# Patient Record
Sex: Male | Born: 1940 | Race: White | Hispanic: No | Marital: Married | Smoking: Never smoker
Health system: Southern US, Community
[De-identification: ages and names within clinical notes are randomized; demographics above are authoritative.]

## PROBLEM LIST (undated history)

## (undated) DIAGNOSIS — E039 Hypothyroidism, unspecified: Secondary | ICD-10-CM

## (undated) DIAGNOSIS — E785 Hyperlipidemia, unspecified: Secondary | ICD-10-CM

## (undated) DIAGNOSIS — I4891 Unspecified atrial fibrillation: Secondary | ICD-10-CM

## (undated) DIAGNOSIS — I10 Essential (primary) hypertension: Secondary | ICD-10-CM

## (undated) DIAGNOSIS — F419 Anxiety disorder, unspecified: Secondary | ICD-10-CM

## (undated) DIAGNOSIS — F329 Major depressive disorder, single episode, unspecified: Secondary | ICD-10-CM

## (undated) DIAGNOSIS — M1990 Unspecified osteoarthritis, unspecified site: Secondary | ICD-10-CM

## (undated) DIAGNOSIS — K219 Gastro-esophageal reflux disease without esophagitis: Secondary | ICD-10-CM

## (undated) DIAGNOSIS — G4730 Sleep apnea, unspecified: Secondary | ICD-10-CM

## (undated) DIAGNOSIS — Z8739 Personal history of other diseases of the musculoskeletal system and connective tissue: Secondary | ICD-10-CM

## (undated) DIAGNOSIS — D509 Iron deficiency anemia, unspecified: Secondary | ICD-10-CM

## (undated) DIAGNOSIS — F32A Depression, unspecified: Secondary | ICD-10-CM

## (undated) HISTORY — PX: JOINT REPLACEMENT: SHX530

## (undated) HISTORY — DX: Iron deficiency anemia, unspecified: D50.9

## (undated) HISTORY — PX: OTHER SURGICAL HISTORY: SHX169

## (undated) HISTORY — DX: Unspecified atrial fibrillation: I48.91

## (undated) HISTORY — PX: CHOLECYSTECTOMY: SHX55

---

## 2005-07-26 ENCOUNTER — Encounter (INDEPENDENT_AMBULATORY_CARE_PROVIDER_SITE_OTHER): Payer: Self-pay | Admitting: Internal Medicine

## 2005-07-26 ENCOUNTER — Ambulatory Visit (HOSPITAL_COMMUNITY): Admission: RE | Admit: 2005-07-26 | Discharge: 2005-07-26 | Payer: Self-pay | Admitting: Internal Medicine

## 2005-07-26 ENCOUNTER — Ambulatory Visit: Payer: Self-pay | Admitting: Internal Medicine

## 2010-08-20 ENCOUNTER — Encounter (INDEPENDENT_AMBULATORY_CARE_PROVIDER_SITE_OTHER): Payer: Self-pay | Admitting: *Deleted

## 2010-10-14 ENCOUNTER — Ambulatory Visit: Payer: Self-pay | Admitting: Internal Medicine

## 2010-10-14 ENCOUNTER — Ambulatory Visit (HOSPITAL_COMMUNITY): Admission: RE | Admit: 2010-10-14 | Discharge: 2010-10-14 | Payer: Self-pay | Admitting: Internal Medicine

## 2011-01-11 NOTE — Letter (Signed)
Summary: Recall, Screening Colonoscopy Only  [facility]  [address]   [place], [place] [address]   Phone: [phone]  Fax: [phone]    August 20, 2010  [facility] [place] [address] Jonny Ruiz [place], [place]  [address] 10-Mar-1941   Dear Mr. Nabi,   Our records indicate it is time to schedule your colonoscopy.    Please call our office at [phone] and ask for the nurse.   Thank you,    Hendricks Limes, LPN Cloria Spring, LPN  [facility] Ph: [phone]   Fax: [phone]

## 2011-02-10 ENCOUNTER — Emergency Department (HOSPITAL_COMMUNITY)
Admission: EM | Admit: 2011-02-10 | Discharge: 2011-02-10 | Disposition: A | Discharge status: Home / Self Care | Payer: Medicare Other | Attending: Emergency Medicine | Admitting: Emergency Medicine

## 2011-02-10 ENCOUNTER — Emergency Department (HOSPITAL_COMMUNITY): Payer: Medicare Other

## 2011-02-10 DIAGNOSIS — M65979 Unspecified synovitis and tenosynovitis, unspecified ankle and foot: Secondary | ICD-10-CM | POA: Insufficient documentation

## 2011-02-10 DIAGNOSIS — L02619 Cutaneous abscess of unspecified foot: Secondary | ICD-10-CM | POA: Insufficient documentation

## 2011-02-10 DIAGNOSIS — M25579 Pain in unspecified ankle and joints of unspecified foot: Secondary | ICD-10-CM | POA: Insufficient documentation

## 2011-02-10 DIAGNOSIS — M109 Gout, unspecified: Secondary | ICD-10-CM | POA: Insufficient documentation

## 2011-02-10 DIAGNOSIS — M659 Synovitis and tenosynovitis, unspecified: Secondary | ICD-10-CM | POA: Insufficient documentation

## 2011-02-10 MED ORDER — GADOBENATE DIMEGLUMINE 529 MG/ML IV SOLN: 15.0000 mL | Freq: Once | INTRAVENOUS | Status: DC | PRN

## 2011-04-29 NOTE — Op Note (Signed)
Allen Hebert, SALZMAN                ACCOUNT NO.:  [id number]   MEDICAL RECORD NO.:  [id number]          PATIENT TYPE:  AMB   LOCATION:  DAY                           FACILITY:  [facility]   PHYSICIAN:  Lionel December, M.D.    DATE OF BIRTH:  Apr 25, 1941   DATE OF PROCEDURE:  07/26/2005  DATE OF DISCHARGE:                                 OPERATIVE REPORT   PROCEDURE:  Colonoscopy with polypectomy.   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  He is now returning for  surveillance examination. Procedures were reviewed the patient, informed  consent was obtained.   PREMEDICATION:  Demerol 50 milligrams IV, Versed 6 milligrams IV in divided  dose.   FINDINGS:  Procedure performed in endoscopy suite. The patient's vital signs  and O2 sat were monitored during procedure and remained stable. The patient  was placed left lateral position. Rectal examination performed. No  abnormality noted external or digital exam.  His prostate was felt to be  large but no nodules or hot areas were noted. The Olympus videoscope was  placed rectum and advanced under vision into sigmoid colon and beyond. He  had few small diverticula at sigmoid colon. Preparation was satisfactory.  Scope was passed cecum which was identified by appendiceal orifice and  ileocecal valve. Two small polyps in this area, one just above ileocecal  valve that was snared and retrieved for histologic examination in two  pieces. Other one was much smaller on a fold on the right side which was  coagulated using snare tip. There was another 6 to 7 mm sessile polyp at the  ascending colon which was snared and retrieved for histologic examination.  Mucosa rest of the colon was normal. Rectal mucosa was also normal. Scope  was retroflexed to examine anorectal junction and small hemorrhoids were  noted below the dentate line. Endoscope was straightened and withdrawn. The  patient  tolerated the procedure well.   FINAL DIAGNOSIS:  Two small polyps snared, one from cecum, another one from  the ascending colon.   Third polyp was even smaller at cecum and was coagulated using snare tip.   Few small scattered diverticula at sigmoid colon, external hemorrhoids.   RECOMMENDATIONS:  Standard instructions given. No aspirin for one week. If  possible, I would like for no hold off his Feldene for the next two to three  days.   I will be contacting the patient with biopsy results and further  recommendations.   High-fiber diet.      Lionel December, M.D.  Electronically Signed     NR/MEDQ  D:  07/26/2005  T:  07/26/2005  Job:  [id number]   cc:   Robyne Peers  [address]  [place]  [place] [address]  Fax: [phone]

## 2013-10-15 ENCOUNTER — Encounter (INDEPENDENT_AMBULATORY_CARE_PROVIDER_SITE_OTHER): Payer: Self-pay | Admitting: *Deleted

## 2013-10-24 ENCOUNTER — Other Ambulatory Visit (INDEPENDENT_AMBULATORY_CARE_PROVIDER_SITE_OTHER): Payer: Self-pay | Admitting: *Deleted

## 2013-10-24 ENCOUNTER — Telehealth (INDEPENDENT_AMBULATORY_CARE_PROVIDER_SITE_OTHER): Payer: Self-pay | Admitting: *Deleted

## 2013-10-24 DIAGNOSIS — Z86010 Personal history of colonic polyps: Secondary | ICD-10-CM

## 2013-10-24 DIAGNOSIS — Z1211 Encounter for screening for malignant neoplasm of colon: Secondary | ICD-10-CM

## 2013-10-24 MED ORDER — AMOXICILLIN 500 MG PO TABS: 500.0000 mg | ORAL_TABLET | ORAL | Status: DC

## 2013-10-24 MED ORDER — PEG-KCL-NACL-NASULF-NA ASC-C 100 G PO SOLR: 1.0000 | Freq: Once | ORAL | Status: DC

## 2013-10-24 NOTE — Telephone Encounter (Signed)
Patient needs movi prep & Amoxicillin -- had hip replacement and has to take this prior to dental procedures so he will need prior to TCS -- he takes Amoxicillin 500 mg 4 tablets prior to procedure

## 2013-10-30 ENCOUNTER — Telehealth (INDEPENDENT_AMBULATORY_CARE_PROVIDER_SITE_OTHER): Payer: Self-pay | Admitting: *Deleted

## 2013-10-30 NOTE — Telephone Encounter (Signed)
agree

## 2013-10-30 NOTE — Telephone Encounter (Signed)
  Procedure: tcs  Reason/Indication:  Hx polyps  Has patient had this procedure before?  Yes, 2011 (paper chart  If so, when, by whom and where?    Is there a family history of colon cancer?  no  Who?  What age when diagnosed?    Is patient diabetic?   no      Does patient have prosthetic heart valve?  no  Do you have a pacemaker?  no  Has patient ever had endocarditis? no  Has patient had joint replacement within last 12 months?  no  Does patient tend to be constipated or take laxatives?   Is patient on Coumadin, Plavix and/or Aspirin? yes  Medications: levothyroxine 75 mcg daily, vit d 3, co 1 10, fish oil, fluoxetine, allopurinol 300 mg daily, simvastatin 20 mg daily, zolpidem 10 mg 1/2 tab daily, asa 81 mg daily, ranitidine 150 mg bid, losartan 25 mg daily, meloxicam 15 mg daily  Allergies: nkda  Medication Adjustment: asa 2 days  Procedure date & time: 11/28/13 at 100

## 2013-11-21 ENCOUNTER — Encounter (HOSPITAL_COMMUNITY): Payer: Self-pay | Admitting: Pharmacy Technician

## 2013-11-28 ENCOUNTER — Encounter (HOSPITAL_COMMUNITY): Payer: Self-pay | Admitting: *Deleted

## 2013-11-28 ENCOUNTER — Encounter (HOSPITAL_COMMUNITY)
Admission: RE | Disposition: A | Discharge status: Home / Self Care | Payer: Self-pay | Source: Ambulatory Visit | Attending: Internal Medicine

## 2013-11-28 ENCOUNTER — Ambulatory Visit (HOSPITAL_COMMUNITY)
Admission: RE | Admit: 2013-11-28 | Discharge: 2013-11-28 | Disposition: A | Discharge status: Home / Self Care | Payer: Medicare Other | Source: Ambulatory Visit | Attending: Internal Medicine | Admitting: Internal Medicine

## 2013-11-28 DIAGNOSIS — Z860100 Personal history of colon polyps, unspecified: Secondary | ICD-10-CM | POA: Insufficient documentation

## 2013-11-28 DIAGNOSIS — D126 Benign neoplasm of colon, unspecified: Secondary | ICD-10-CM | POA: Insufficient documentation

## 2013-11-28 DIAGNOSIS — D128 Benign neoplasm of rectum: Secondary | ICD-10-CM | POA: Insufficient documentation

## 2013-11-28 DIAGNOSIS — K5730 Diverticulosis of large intestine without perforation or abscess without bleeding: Secondary | ICD-10-CM | POA: Insufficient documentation

## 2013-11-28 DIAGNOSIS — I10 Essential (primary) hypertension: Secondary | ICD-10-CM | POA: Insufficient documentation

## 2013-11-28 DIAGNOSIS — K5732 Diverticulitis of large intestine without perforation or abscess without bleeding: Secondary | ICD-10-CM

## 2013-11-28 DIAGNOSIS — Z7982 Long term (current) use of aspirin: Secondary | ICD-10-CM | POA: Insufficient documentation

## 2013-11-28 DIAGNOSIS — D129 Benign neoplasm of anus and anal canal: Secondary | ICD-10-CM

## 2013-11-28 HISTORY — DX: Gastro-esophageal reflux disease without esophagitis: K21.9

## 2013-11-28 HISTORY — PX: COLONOSCOPY: SHX5424

## 2013-11-28 HISTORY — DX: Major depressive disorder, single episode, unspecified: F32.9

## 2013-11-28 HISTORY — DX: Unspecified osteoarthritis, unspecified site: M19.90

## 2013-11-28 HISTORY — DX: Hypothyroidism, unspecified: E03.9

## 2013-11-28 HISTORY — DX: Essential (primary) hypertension: I10

## 2013-11-28 HISTORY — DX: Hyperlipidemia, unspecified: E78.5

## 2013-11-28 HISTORY — DX: Depression, unspecified: F32.A

## 2013-11-28 HISTORY — DX: Anxiety disorder, unspecified: F41.9

## 2013-11-28 SURGERY — COLONOSCOPY
Anesthesia: Moderate Sedation

## 2013-11-28 MED ORDER — MEPERIDINE HCL 50 MG/ML IJ SOLN
INTRAMUSCULAR | Status: DC | PRN
  Administered 2013-11-28 (×2): 25 mg via INTRAVENOUS

## 2013-11-28 MED ORDER — MIDAZOLAM HCL 5 MG/5ML IJ SOLN
INTRAMUSCULAR | Status: DC | PRN
  Administered 2013-11-28 (×3): 2 mg via INTRAVENOUS

## 2013-11-28 MED ORDER — STERILE WATER FOR IRRIGATION IR SOLN
Status: DC | PRN
  Administered 2013-11-28: 12:00:00

## 2013-11-28 MED ORDER — METRONIDAZOLE 500 MG PO TABS: 500.0000 mg | ORAL_TABLET | Freq: Two times a day (BID) | ORAL | Status: DC

## 2013-11-28 MED ORDER — MIDAZOLAM HCL 5 MG/5ML IJ SOLN
INTRAMUSCULAR | Status: AC
  Filled 2013-11-28: qty 10

## 2013-11-28 MED ORDER — SODIUM CHLORIDE 0.9 % IV SOLN
INTRAVENOUS | Status: DC
  Administered 2013-11-28: 1000 mL via INTRAVENOUS

## 2013-11-28 MED ORDER — MEPERIDINE HCL 50 MG/ML IJ SOLN
INTRAMUSCULAR | Status: AC
  Filled 2013-11-28: qty 1

## 2013-11-28 MED ORDER — CIPROFLOXACIN HCL 500 MG PO TABS: 500.0000 mg | ORAL_TABLET | Freq: Two times a day (BID) | ORAL | Status: DC

## 2013-11-28 NOTE — Op Note (Signed)
COLONOSCOPY PROCEDURE REPORT  PATIENT:  Allen Hebert  MR#:  [id number] Birthdate:  [birth year], 72 y.o., male Endoscopist:  Dr. Malissa Hippo, MD Referred By:  Dr. Benancio Deeds MD  Procedure Date: 11/28/2013  Procedure:   Colonoscopy  Indications:  Patient is 72 year old Caucasian male with history of colonic adenomas who is here for surveillance colonoscopy. He presently does not have any GI symptoms.  Informed Consent:  The procedure and risks were reviewed with the patient and informed consent was obtained.  Medications:  Demerol 50 mg IV Versed 6 mg IV  Description of procedure:  After a digital rectal exam was performed, that colonoscope was advanced from the anus through the rectum and colon to the area of the cecum, ileocecal valve and appendiceal orifice. The cecum was deeply intubated. These structures were well-seen and photographed for the record. From the level of the cecum and ileocecal valve, the scope was slowly and cautiously withdrawn. The mucosal surfaces were carefully surveyed utilizing scope tip to flexion to facilitate fold flattening as needed. The scope was pulled down into the rectum where a thorough exam including retroflexion was performed.  Findings:   Prep satisfactory. Three small polyps were removed using cold snare from hepatic flexure. Small polyp from sigmoid colon was ablated via cold biopsy. Small polyp from rectosigmoid junction was removed using cold snare. All of these polyps were submitted together. 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   Therapeutic/Diagnostic Maneuvers Performed:  See above  Complications:  See above  Cecal Withdrawal Time:  22 minutes  Impression:  Examination performed to cecum. Five small polyps were removed and submitted together( three from hepatic flexure were cold snared,  one from sigmoid colon was removed via cold biopsy and polyp at rectosigmoid junction was cold snared). 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  Recommendations:  Standard instructions given. Cipro 500 mg by mouth twice a day for 10 days. Metronidazole 500 mg by mouth twice a day for 10 days. Consider dropping meloxicam dose to half if possible. I will contact patient with biopsy results and further recommendations.  Jessicalynn Deshong U  11/28/2013 1:22 PM  CC: Dr. Benancio Deeds, MD & Dr. Bonnetta Barry ref. provider found

## 2013-11-28 NOTE — H&P (Signed)
Allen Hebert is an 72 y.o. male.   Chief Complaint: Patient is here for colonoscopy. HPI: Patient is 72 year old Caucasian male with history of colonic adenomas. He had multiple adenomas removed on his last exam of November 2011. He denies abdominal pain change in his bowel habits or rectal bleeding. Family history is negative for CRC.  Past Medical History  Diagnosis Date  . Hyperlipemia   . Hypertension   . Hypothyroidism   . Anxiety   . Depression   . GERD (gastroesophageal reflux disease)   . Arthritis     Past Surgical History  Procedure Laterality Date  . Joint replacement Bilateral   . Right knee surg    . Right arm surg      History reviewed. No pertinent family history. Social History:  reports that he has never smoked. He does not have any smokeless tobacco history on file. His alcohol and drug histories are not on file.  Allergies:  Allergies  Allergen Reactions  . Lipitor [Atorvastatin]     Medications Prior to Admission  Medication Sig Dispense Refill  . allopurinol (ZYLOPRIM) 300 MG tablet Take 300 mg by mouth at bedtime.      Marland Kitchen amoxicillin (AMOXIL) 500 MG tablet Take 1 tablet (500 mg total) by mouth as directed.  4 tablet  0  . aspirin EC 81 MG tablet Take 81 mg by mouth daily.      . cetirizine (ZYRTEC) 5 MG tablet Take 5 mg by mouth daily as needed for allergies.      . cholecalciferol (VITAMIN D) 1000 UNITS tablet Take 1,000 Units by mouth daily.      . diphenhydramine-acetaminophen (TYLENOL PM) 25-500 MG TABS Take 2 tablets by mouth at bedtime as needed (sleep).      Marland Kitchen FLUoxetine (PROZAC) 20 MG capsule Take 40 mg by mouth at bedtime.      Marland Kitchen levothyroxine (SYNTHROID, LEVOTHROID) 75 MCG tablet Take 75 mcg by mouth daily before breakfast.      . losartan (COZAAR) 25 MG tablet Take 25 mg by mouth daily.      . meloxicam (MOBIC) 15 MG tablet Take 15 mg by mouth daily.      . Omega-3 Fatty Acids (FISH OIL) 1000 MG CAPS Take 1 capsule by mouth daily.      .  peg 3350 powder (MOVIPREP) 100 G SOLR Take 1 kit (200 g total) by mouth once.  1 kit  0  . ranitidine (ZANTAC) 150 MG tablet Take 150 mg by mouth 2 (two) times daily.      . simvastatin (ZOCOR) 20 MG tablet Take 20 mg by mouth at bedtime.      Marland Kitchen zolpidem (AMBIEN) 5 MG tablet Take 5 mg by mouth at bedtime as needed for sleep.        No results found for this or any previous visit (from the past 48 hour(s)). No results found.  ROS  Blood pressure 157/87, pulse 71, temperature 97.8 F (36.6 C), temperature source Oral, resp. rate 17, height 5\' 11"  (1.803 m), weight 194 lb (87.998 kg), SpO2 98.00%. Physical Exam  Constitutional: He appears well-developed and well-nourished.  HENT:  Mouth/Throat: Oropharynx is clear and moist.  Eyes: Conjunctivae are normal. No scleral icterus.  Neck: No thyromegaly present.  Cardiovascular: Normal rate, regular rhythm and normal heart sounds.   No murmur heard. Respiratory: Effort normal and breath sounds normal.  GI: Soft. He exhibits no distension and no mass. There is no tenderness.  Musculoskeletal: He exhibits no edema.  Lymphadenopathy:    He has no cervical adenopathy.  Neurological: He is alert.  Skin: Skin is warm and dry.     Assessment/Plan History of colonic adenomas. Surveillance colonoscopy.  Nashia Remus U 11/28/2013, 12:21 PM

## 2013-12-02 ENCOUNTER — Encounter (HOSPITAL_COMMUNITY): Payer: Self-pay | Admitting: Internal Medicine

## 2013-12-10 ENCOUNTER — Encounter (INDEPENDENT_AMBULATORY_CARE_PROVIDER_SITE_OTHER): Payer: Self-pay | Admitting: *Deleted

## 2016-07-04 NOTE — Patient Instructions (Signed)
Your procedure is scheduled on: 07/11/2016  Report to [facility] at  48   AM.  Call this number if you have problems the morning of surgery: [phone]   Do not eat food or drink liquids :After Midnight.      Take these medicines the morning of surgery with A SIP OF WATER: allopurinol, zyrtec, prozac, levothyroxine, cozaar, mobic, zantac.   Do not wear jewelry, make-up or nail polish.  Do not wear lotions, powders, or perfumes. You may wear deodorant.  Do not shave 48 hours prior to surgery.  Do not bring valuables to the hospital.  Contacts, dentures or bridgework may not be worn into surgery.  Leave suitcase in the car. After surgery it may be brought to your room.  For patients admitted to the hospital, checkout time is 11:00 AM the day of discharge.   Patients discharged the day of surgery will not be allowed to drive home.  :     Please read over the following fact sheets that you were given: Coughing and Deep Breathing, Surgical Site Infection Prevention, Anesthesia Post-op Instructions and Care and Recovery After Surgery    Cataract A cataract is a clouding of the lens of the eye. When a lens becomes cloudy, vision is reduced based on the degree and nature of the clouding. Many cataracts reduce vision to some degree. Some cataracts make people more near-sighted as they develop. Other cataracts increase glare. Cataracts that are ignored and become worse can sometimes look white. The white color can be seen through the pupil. CAUSES   Aging. However, cataracts may occur at any age, even in newborns.   Certain drugs.   Trauma to the eye.   Certain diseases such as diabetes.   Specific eye diseases such as chronic inflammation inside the eye or a sudden attack of a rare form of glaucoma.   Inherited or acquired medical problems.  SYMPTOMS   Gradual, progressive drop in vision in the affected eye.   Severe, rapid visual loss. This most often happens when trauma is the  cause.  DIAGNOSIS  To detect a cataract, an eye doctor examines the lens. Cataracts are best diagnosed with an exam of the eyes with the pupils enlarged (dilated) by drops.  TREATMENT  For an early cataract, vision may improve by using different eyeglasses or stronger lighting. If that does not help your vision, surgery is the only effective treatment. A cataract needs to be surgically removed when vision loss interferes with your everyday activities, such as driving, reading, or watching TV. A cataract may also have to be removed if it prevents examination or treatment of another eye problem. Surgery removes the cloudy lens and usually replaces it with a substitute lens (intraocular lens, IOL).  At a time when both you and your doctor agree, the cataract will be surgically removed. If you have cataracts in both eyes, only one is usually removed at a time. This allows the operated eye to heal and be out of danger from any possible problems after surgery (such as infection or poor wound healing). In rare cases, a cataract may be doing damage to your eye. In these cases, your caregiver may advise surgical removal right away. The vast majority of people who have cataract surgery have better vision afterward. HOME CARE INSTRUCTIONS  If you are not planning surgery, you may be asked to do the following:  Use different eyeglasses.   Use stronger or brighter lighting.   Ask your eye  doctor about reducing your medicine dose or changing medicines if it is thought that a medicine caused your cataract. Changing medicines does not make the cataract go away on its own.   Become familiar with your surroundings. Poor vision can lead to injury. Avoid bumping into things on the affected side. You are at a higher risk for tripping or falling.   Exercise extreme care when driving or operating machinery.   Wear sunglasses if you are sensitive to bright light or experiencing problems with glare.  SEEK IMMEDIATE  MEDICAL CARE IF:   You have a worsening or sudden vision loss.   You notice redness, swelling, or increasing pain in the eye.   You have a fever.  Document Released: 11/28/2005 Document Revised: 11/17/2011 Document Reviewed: 07/22/2011 [facility] Patient Information 2012 [place].PATIENT INSTRUCTIONS POST-ANESTHESIA  IMMEDIATELY FOLLOWING SURGERY:  Do not drive or operate machinery for the first twenty four hours after surgery.  Do not make any important decisions for twenty four hours after surgery or while taking narcotic pain medications or sedatives.  If you develop intractable nausea and vomiting or a severe headache please notify your doctor immediately.  FOLLOW-UP:  Please make an appointment with your surgeon as instructed. You do not need to follow up with anesthesia unless specifically instructed to do so.  WOUND CARE INSTRUCTIONS (if applicable):  Keep a dry clean dressing on the anesthesia/puncture wound site if there is drainage.  Once the wound has quit draining you may leave it open to air.  Generally you should leave the bandage intact for twenty four hours unless there is drainage.  If the epidural site drains for more than 36-48 hours please call the anesthesia department.  QUESTIONS?:  Please feel free to call your physician or the hospital operator if you have any questions, and they will be happy to assist you.

## 2016-07-05 ENCOUNTER — Encounter (HOSPITAL_COMMUNITY): Payer: Self-pay

## 2016-07-05 ENCOUNTER — Encounter (HOSPITAL_COMMUNITY)
Admission: RE | Admit: 2016-07-05 | Discharge: 2016-07-05 | Disposition: A | Discharge status: Home / Self Care | Payer: Medicare Other | Source: Ambulatory Visit | Attending: Ophthalmology | Admitting: Ophthalmology

## 2016-07-11 ENCOUNTER — Ambulatory Visit (HOSPITAL_COMMUNITY): Admission: RE | Admit: 2016-07-11 | Payer: Medicare Other | Source: Ambulatory Visit | Admitting: Ophthalmology

## 2016-07-11 ENCOUNTER — Encounter (HOSPITAL_COMMUNITY): Admission: RE | Payer: Self-pay | Source: Ambulatory Visit

## 2016-07-11 SURGERY — PHACOEMULSIFICATION, CATARACT, WITH IOL INSERTION
Anesthesia: Monitor Anesthesia Care | Laterality: Right

## 2016-07-26 NOTE — Patient Instructions (Signed)
Your procedure is scheduled on: 08/01/2016  Report to [facility] at   750  AM.  Call this number if you have problems the morning of surgery: [phone]   Do not eat food or drink liquids :After Midnight.      Take these medicines the morning of surgery with A SIP OF WATER: zyrtec, prozac, synthroid, cozaar, zantac.   Do not wear jewelry, make-up or nail polish.  Do not wear lotions, powders, or perfumes. You may wear deodorant.  Do not shave 48 hours prior to surgery.  Do not bring valuables to the hospital.  Contacts, dentures or bridgework may not be worn into surgery.  Leave suitcase in the car. After surgery it may be brought to your room.  For patients admitted to the hospital, checkout time is 11:00 AM the day of discharge.   Patients discharged the day of surgery will not be allowed to drive home.  :     Please read over the following fact sheets that you were given: Coughing and Deep Breathing, Surgical Site Infection Prevention, Anesthesia Post-op Instructions and Care and Recovery After Surgery    Cataract A cataract is a clouding of the lens of the eye. When a lens becomes cloudy, vision is reduced based on the degree and nature of the clouding. Many cataracts reduce vision to some degree. Some cataracts make people more near-sighted as they develop. Other cataracts increase glare. Cataracts that are ignored and become worse can sometimes look white. The white color can be seen through the pupil. CAUSES   Aging. However, cataracts may occur at any age, even in newborns.   Certain drugs.   Trauma to the eye.   Certain diseases such as diabetes.   Specific eye diseases such as chronic inflammation inside the eye or a sudden attack of a rare form of glaucoma.   Inherited or acquired medical problems.  SYMPTOMS   Gradual, progressive drop in vision in the affected eye.   Severe, rapid visual loss. This most often happens when trauma is the cause.  DIAGNOSIS  To  detect a cataract, an eye doctor examines the lens. Cataracts are best diagnosed with an exam of the eyes with the pupils enlarged (dilated) by drops.  TREATMENT  For an early cataract, vision may improve by using different eyeglasses or stronger lighting. If that does not help your vision, surgery is the only effective treatment. A cataract needs to be surgically removed when vision loss interferes with your everyday activities, such as driving, reading, or watching TV. A cataract may also have to be removed if it prevents examination or treatment of another eye problem. Surgery removes the cloudy lens and usually replaces it with a substitute lens (intraocular lens, IOL).  At a time when both you and your doctor agree, the cataract will be surgically removed. If you have cataracts in both eyes, only one is usually removed at a time. This allows the operated eye to heal and be out of danger from any possible problems after surgery (such as infection or poor wound healing). In rare cases, a cataract may be doing damage to your eye. In these cases, your caregiver may advise surgical removal right away. The vast majority of people who have cataract surgery have better vision afterward. HOME CARE INSTRUCTIONS  If you are not planning surgery, you may be asked to do the following:  Use different eyeglasses.   Use stronger or brighter lighting.   Ask your eye doctor about  reducing your medicine dose or changing medicines if it is thought that a medicine caused your cataract. Changing medicines does not make the cataract go away on its own.   Become familiar with your surroundings. Poor vision can lead to injury. Avoid bumping into things on the affected side. You are at a higher risk for tripping or falling.   Exercise extreme care when driving or operating machinery.   Wear sunglasses if you are sensitive to bright light or experiencing problems with glare.  SEEK IMMEDIATE MEDICAL CARE IF:   You have  a worsening or sudden vision loss.   You notice redness, swelling, or increasing pain in the eye.   You have a fever.  Document Released: 11/28/2005 Document Revised: 11/17/2011 Document Reviewed: 07/22/2011 [facility] Patient Information 2012 [place].PATIENT INSTRUCTIONS POST-ANESTHESIA  IMMEDIATELY FOLLOWING SURGERY:  Do not drive or operate machinery for the first twenty four hours after surgery.  Do not make any important decisions for twenty four hours after surgery or while taking narcotic pain medications or sedatives.  If you develop intractable nausea and vomiting or a severe headache please notify your doctor immediately.  FOLLOW-UP:  Please make an appointment with your surgeon as instructed. You do not need to follow up with anesthesia unless specifically instructed to do so.  WOUND CARE INSTRUCTIONS (if applicable):  Keep a dry clean dressing on the anesthesia/puncture wound site if there is drainage.  Once the wound has quit draining you may leave it open to air.  Generally you should leave the bandage intact for twenty four hours unless there is drainage.  If the epidural site drains for more than 36-48 hours please call the anesthesia department.  QUESTIONS?:  Please feel free to call your physician or the hospital operator if you have any questions, and they will be happy to assist you.

## 2016-07-26 NOTE — Patient Instructions (Addendum)
    Allen Hebert  07/26/2016      [facility] [address] [place] - [address] [address] [place] [address] Phone: [phone] Fax: [phone]    Your procedure is scheduled on 08/01/2016.  Report to [facility] at 7:50 A.M.  Call this number if you have problems the morning of surgery:  [phone]   Remember:  Do not eat food or drink liquids after midnight.  Take these medicines the morning of surgery with A SIP OF WATER :    Do not wear jewelry, make-up or nail polish.  Do not wear lotions, powders, or perfumes.  You may wear deoderant.  Do not shave 48 hours prior to surgery.  Men may shave face and neck.  Do not bring valuables to the hospital.  [facility] is not responsible for any belongings or valuables.  Contacts, dentures or bridgework may not be worn into surgery.  Leave your suitcase in the car.  After surgery it may be brought to your room.  For patients admitted to the hospital, discharge time will be determined by your treatment team.  Patients discharged the day of surgery will not be allowed to drive home.   Name and phone number of your driver:   FAMILY Special instructions:  N/A  Please read over the following fact sheets that you were given. Care and Recovery After Surgery

## 2016-07-27 ENCOUNTER — Encounter (HOSPITAL_COMMUNITY): Payer: Self-pay

## 2016-07-27 ENCOUNTER — Encounter (HOSPITAL_COMMUNITY)
Admission: RE | Admit: 2016-07-27 | Discharge: 2016-07-27 | Disposition: A | Discharge status: Home / Self Care | Payer: Medicare Other | Source: Ambulatory Visit | Attending: Ophthalmology | Admitting: Ophthalmology

## 2016-07-27 DIAGNOSIS — Z01812 Encounter for preprocedural laboratory examination: Secondary | ICD-10-CM | POA: Insufficient documentation

## 2016-07-27 DIAGNOSIS — Z01810 Encounter for preprocedural cardiovascular examination: Secondary | ICD-10-CM | POA: Insufficient documentation

## 2016-07-27 HISTORY — DX: Personal history of other diseases of the musculoskeletal system and connective tissue: Z87.39

## 2016-07-27 LAB — CBC WITH DIFFERENTIAL/PLATELET
BASOS PCT: 1 %
Basophils Absolute: 0.1 10*3/uL (ref 0.0–0.1)
EOS ABS: 0.1 10*3/uL (ref 0.0–0.7)
Eosinophils Relative: 2 %
HCT: 41.8 % (ref 39.0–52.0)
HEMOGLOBIN: 14.4 g/dL (ref 13.0–17.0)
Lymphocytes Relative: 20 %
Lymphs Abs: 1.3 10*3/uL (ref 0.7–4.0)
MCH: 32.1 pg (ref 26.0–34.0)
MCHC: 34.4 g/dL (ref 30.0–36.0)
MCV: 93.3 fL (ref 78.0–100.0)
MONO ABS: 0.6 10*3/uL (ref 0.1–1.0)
MONOS PCT: 9 %
NEUTROS PCT: 68 %
Neutro Abs: 4.7 10*3/uL (ref 1.7–7.7)
Platelets: 229 10*3/uL (ref 150–400)
RBC: 4.48 MIL/uL (ref 4.22–5.81)
RDW: 13.6 % (ref 11.5–15.5)
WBC: 6.8 10*3/uL (ref 4.0–10.5)

## 2016-07-27 LAB — BASIC METABOLIC PANEL
Anion gap: 8 (ref 5–15)
BUN: 28 mg/dL — ABNORMAL HIGH (ref 6–20)
CALCIUM: 9.1 mg/dL (ref 8.9–10.3)
CO2: 22 mmol/L (ref 22–32)
CREATININE: 1.43 mg/dL — AB (ref 0.61–1.24)
Chloride: 106 mmol/L (ref 101–111)
GFR, EST AFRICAN AMERICAN: 53 mL/min — AB (ref >60)
GFR, EST NON AFRICAN AMERICAN: 46 mL/min — AB (ref >60)
Glucose, Bld: 95 mg/dL (ref 65–99)
Potassium: 4.1 mmol/L (ref 3.5–5.1)
SODIUM: 136 mmol/L (ref 135–145)

## 2016-07-27 NOTE — Pre-Procedure Instructions (Signed)
Patient given information to sign up for my chart at home. 

## 2016-07-29 MED ORDER — LIDOCAINE HCL 3.5 % OP GEL: 1.0000 "application " | Freq: Once | OPHTHALMIC | Status: DC

## 2016-08-01 ENCOUNTER — Encounter (HOSPITAL_COMMUNITY)
Admission: RE | Disposition: A | Discharge status: Home / Self Care | Payer: Self-pay | Source: Ambulatory Visit | Attending: Ophthalmology

## 2016-08-01 ENCOUNTER — Ambulatory Visit (HOSPITAL_COMMUNITY)
Admission: RE | Admit: 2016-08-01 | Discharge: 2016-08-01 | Disposition: A | Discharge status: Home / Self Care | Payer: Medicare Other | Source: Ambulatory Visit | Attending: Ophthalmology | Admitting: Ophthalmology

## 2016-08-01 ENCOUNTER — Ambulatory Visit (HOSPITAL_COMMUNITY): Payer: Medicare Other | Admitting: Anesthesiology

## 2016-08-01 ENCOUNTER — Encounter (HOSPITAL_COMMUNITY): Payer: Self-pay | Admitting: *Deleted

## 2016-08-01 DIAGNOSIS — H2511 Age-related nuclear cataract, right eye: Secondary | ICD-10-CM | POA: Diagnosis not present

## 2016-08-01 DIAGNOSIS — F418 Other specified anxiety disorders: Secondary | ICD-10-CM | POA: Insufficient documentation

## 2016-08-01 DIAGNOSIS — E039 Hypothyroidism, unspecified: Secondary | ICD-10-CM | POA: Insufficient documentation

## 2016-08-01 DIAGNOSIS — M1990 Unspecified osteoarthritis, unspecified site: Secondary | ICD-10-CM | POA: Diagnosis not present

## 2016-08-01 DIAGNOSIS — Z888 Allergy status to other drugs, medicaments and biological substances status: Secondary | ICD-10-CM | POA: Diagnosis not present

## 2016-08-01 DIAGNOSIS — E669 Obesity, unspecified: Secondary | ICD-10-CM | POA: Diagnosis not present

## 2016-08-01 DIAGNOSIS — K219 Gastro-esophageal reflux disease without esophagitis: Secondary | ICD-10-CM | POA: Diagnosis not present

## 2016-08-01 DIAGNOSIS — I10 Essential (primary) hypertension: Secondary | ICD-10-CM | POA: Diagnosis not present

## 2016-08-01 HISTORY — PX: CATARACT EXTRACTION W/PHACO: SHX586

## 2016-08-01 SURGERY — PHACOEMULSIFICATION, CATARACT, WITH IOL INSERTION
Anesthesia: Monitor Anesthesia Care | Site: Eye | Laterality: Right

## 2016-08-01 MED ORDER — PHENYLEPHRINE-KETOROLAC 1-0.3 % IO SOLN
INTRAOCULAR | Status: DC | PRN
  Administered 2016-08-01: 500 mL via OPHTHALMIC

## 2016-08-01 MED ORDER — ONDANSETRON HCL 4 MG/2ML IJ SOLN
INTRAMUSCULAR | Status: AC
  Filled 2016-08-01: qty 2

## 2016-08-01 MED ORDER — TETRACAINE HCL 0.5 % OP SOLN
1.0000 [drp] | OPHTHALMIC | Status: AC
  Administered 2016-08-01 (×3): 1 [drp] via OPHTHALMIC

## 2016-08-01 MED ORDER — MIDAZOLAM HCL 5 MG/5ML IJ SOLN
INTRAMUSCULAR | Status: DC | PRN
  Administered 2016-08-01: 2 mg via INTRAVENOUS

## 2016-08-01 MED ORDER — MIDAZOLAM HCL 2 MG/2ML IJ SOLN
INTRAMUSCULAR | Status: AC
  Filled 2016-08-01: qty 2

## 2016-08-01 MED ORDER — TETRACAINE 0.5 % OP SOLN OPTIME - NO CHARGE
OPHTHALMIC | Status: DC | PRN
  Administered 2016-08-01: 1 [drp] via OPHTHALMIC

## 2016-08-01 MED ORDER — LACTATED RINGERS IV SOLN
INTRAVENOUS | Status: DC
  Administered 2016-08-01: 09:00:00 via INTRAVENOUS

## 2016-08-01 MED ORDER — LIDOCAINE HCL 3.5 % OP GEL
OPHTHALMIC | Status: DC | PRN
  Administered 2016-08-01: 1 via OPHTHALMIC

## 2016-08-01 MED ORDER — CYCLOPENTOLATE-PHENYLEPHRINE 0.2-1 % OP SOLN
1.0000 [drp] | OPHTHALMIC | Status: AC
  Administered 2016-08-01 (×3): 1 [drp] via OPHTHALMIC

## 2016-08-01 MED ORDER — LIDOCAINE HCL 3.5 % OP GEL: 1.0000 "application " | Freq: Once | OPHTHALMIC | Status: DC

## 2016-08-01 MED ORDER — PHENYLEPHRINE-KETOROLAC 1-0.3 % IO SOLN
INTRAOCULAR | Status: AC
  Filled 2016-08-01: qty 4

## 2016-08-01 MED ORDER — ONDANSETRON HCL 4 MG/2ML IJ SOLN
INTRAMUSCULAR | Status: DC | PRN
  Administered 2016-08-01: 4 mg via INTRAVENOUS

## 2016-08-01 MED ORDER — NA HYALUR & NA CHOND-NA HYALUR 0.55-0.5 ML IO KIT
PACK | INTRAOCULAR | Status: DC | PRN
  Administered 2016-08-01: 1 via OPHTHALMIC

## 2016-08-01 MED ORDER — BSS IO SOLN
INTRAOCULAR | Status: DC | PRN
  Administered 2016-08-01: 15 mL via INTRAOCULAR

## 2016-08-01 MED ORDER — POVIDONE-IODINE 5 % OP SOLN
OPHTHALMIC | Status: DC | PRN
  Administered 2016-08-01: 1 via OPHTHALMIC

## 2016-08-01 SURGICAL SUPPLY — 10 items
CLOTH BEACON ORANGE TIMEOUT ST (SAFETY) ×2 IMPLANT
GLOVE BIOGEL PI IND STRL 6.5 (GLOVE) ×1 IMPLANT
GLOVE BIOGEL PI INDICATOR 6.5 (GLOVE) ×1
GLOVE EXAM NITRILE MD LF STRL (GLOVE) ×2 IMPLANT
INST SET CATARACT ~~LOC~~ (KITS) ×2 IMPLANT
LENS IOL ACRYSOF IQ TORIC 15.0 ×2 IMPLANT
PAD ARMBOARD 7.5X6 YLW CONV (MISCELLANEOUS) ×2 IMPLANT
PROC W SPEC LENS (INTRAOCULAR LENS) ×2
PROCESS W SPEC LENS (INTRAOCULAR LENS) ×1 IMPLANT
WATER STERILE IRR 250ML POUR (IV SOLUTION) ×2 IMPLANT

## 2016-08-01 NOTE — Transfer of Care (Signed)
Immediate Anesthesia Transfer of Care Note  Patient: Allen Hebert  Procedure(s) Performed: Procedure(s) with comments: CATARACT EXTRACTION PHACO AND INTRAOCULAR LENS PLACEMENT (IOC) (Right) - CDE: 3.27  Patient Location: Short Stay  Anesthesia Type:MAC  Level of Consciousness: awake, alert , oriented and patient cooperative  Airway & Oxygen Therapy: Patient Spontanous Breathing  Post-op Assessment: Report given to RN and Post -op Vital signs reviewed and stable  Post vital signs: Reviewed and stable  Last Vitals:  Vitals:   08/01/16 0850  BP: (!) 167/86  Pulse: 70  Resp: 18  Temp: 36.7 C    Last Pain:  Vitals:   08/01/16 0850  TempSrc: Oral  PainSc: 5       Patients Stated Pain Goal: 5 ([id number])  Complications: No apparent anesthesia complications

## 2016-08-01 NOTE — Brief Op Note (Signed)
08/01/2016  12:00 PM  PATIENT:  Allen Hebert  75 y.o. male  PRE-OPERATIVE DIAGNOSIS:  nuclear cataract right eye  POST-OPERATIVE DIAGNOSIS:  nuclear cataract right eye  PROCEDURE:  Procedure(s): CATARACT EXTRACTION PHACO AND INTRAOCULAR LENS PLACEMENT (IOC)  SURGEON:  Surgeon(s): Williams Che, MD  ASSISTANTS: Cleda Clarks, CST   ANESTHESIA STAFF: Anesthesiologist: Josephine Igo, MD CRNA: Mickel Baas, CRNA  ANESTHESIA:   topical and MAC  REQUESTED LENS POWER: 20.5  LENS IMPLANT INFORMATION:   Alcon SN6AT4 20.5  @180  degrees  CUMULATIVE DISSIPATED ENERGY:3.27  INDICATIONS:see scanned office H&P for details  OP FINDINGS:dense NS  COMPLICATIONS:None  DICTATION #: none  PLAN OF CARE: as above  PATIENT DISPOSITION:  Short Stay

## 2016-08-01 NOTE — Addendum Note (Signed)
Addendum  created 08/01/16 1059 by Josephine Igo, MD   Anesthesia Attestations filed, Sign clinical note

## 2016-08-01 NOTE — Anesthesia Procedure Notes (Signed)
Procedure Name: MAC Date/Time: 08/01/2016 9:23 AM Performed by: Andree Elk, AMY A Pre-anesthesia Checklist: Patient identified, Timeout performed, Emergency Drugs available and Suction available Oxygen Delivery Method: Nasal cannula

## 2016-08-01 NOTE — Anesthesia Preprocedure Evaluation (Addendum)
Anesthesia Evaluation  Patient identified by MRN, date of birth, ID band Patient awake    Reviewed: Allergy & Precautions, NPO status , Patient's Chart, lab work & pertinent test results  Airway Mallampati: II  TM Distance: >3 FB Neck ROM: Full    Dental no notable dental hx. (+) Teeth Intact, Caps,    Pulmonary neg pulmonary ROS,    Pulmonary exam normal breath sounds clear to auscultation       Cardiovascular hypertension, Pt. on medications Normal cardiovascular exam Rhythm:Regular Rate:Normal     Neuro/Psych PSYCHIATRIC DISORDERS Anxiety Depression Cataract OD    GI/Hepatic Neg liver ROS, GERD  Medicated and Controlled,  Endo/Other  Hypothyroidism Obesity  Renal/GU negative Renal ROS  negative genitourinary   Musculoskeletal  (+) Arthritis ,   Abdominal (+) + obese,   Peds  Hematology negative hematology ROS (+)   Anesthesia Other Findings   Reproductive/Obstetrics                            Anesthesia Physical Anesthesia Plan  ASA: II  Anesthesia Plan: MAC   Post-op Pain Management:    Induction:   Airway Management Planned: Natural Airway and Nasal Cannula  Additional Equipment:   Intra-op Plan:   Post-operative Plan:   Informed Consent: I have reviewed the patients History and Physical, chart, labs and discussed the procedure including the risks, benefits and alternatives for the proposed anesthesia with the patient or authorized representative who has indicated his/her understanding and acceptance.   Dental advisory given  Plan Discussed with: Anesthesiologist, CRNA and Surgeon  Anesthesia Plan Comments:         Anesthesia Quick Evaluation

## 2016-08-01 NOTE — Anesthesia Postprocedure Evaluation (Signed)
Anesthesia Post Note  Patient: Allen Hebert  Procedure(s) Performed: Procedure(s) (LRB): CATARACT EXTRACTION PHACO AND INTRAOCULAR LENS PLACEMENT (IOC) (Right)  Patient location during evaluation: PACU Anesthesia Type: MAC Level of consciousness: awake and alert and oriented Pain management: pain level controlled Vital Signs Assessment: post-procedure vital signs reviewed and stable Respiratory status: spontaneous breathing, nonlabored ventilation and respiratory function stable Cardiovascular status: stable and blood pressure returned to baseline Postop Assessment: no signs of nausea or vomiting Anesthetic complications: no    Last Vitals:  Vitals:   08/01/16 0850 08/01/16 1032  BP: (!) 167/86 133/83  Pulse: 70 66  Resp: 18 18  Temp: 36.7 C 36.6 C    Last Pain:  Vitals:   08/01/16 1032  TempSrc: Oral  PainSc:                  [name] A.

## 2016-08-01 NOTE — Anesthesia Postprocedure Evaluation (Signed)
Anesthesia Post Note  Patient: Allen Hebert  Procedure(s) Performed: Procedure(s) (LRB): CATARACT EXTRACTION PHACO AND INTRAOCULAR LENS PLACEMENT (IOC) (Right)  Patient location during evaluation: Short Stay Anesthesia Type: MAC Level of consciousness: awake and alert and oriented Pain management: pain level controlled Vital Signs Assessment: post-procedure vital signs reviewed and stable Respiratory status: spontaneous breathing Cardiovascular status: stable Postop Assessment: no signs of nausea or vomiting Anesthetic complications: no    Last Vitals:  Vitals:   08/01/16 0850  BP: (!) 167/86  Pulse: 70  Resp: 18  Temp: 36.7 C    Last Pain:  Vitals:   08/01/16 0850  TempSrc: Oral  PainSc: 5                  ADAMS, AMY A

## 2016-08-01 NOTE — H&P (Signed)
I have reviewed the pre printed H&P, the patient was re-examined, and I have identified no significant interval changes in the patient's medical condition.  There is no change in the plan of care since the history and physical of record. 

## 2016-08-01 NOTE — Op Note (Signed)
08/01/2016  12:00 PM  PATIENT:  Allen Hebert  75 y.o. male  PRE-OPERATIVE DIAGNOSIS:  nuclear cataract right eye  POST-OPERATIVE DIAGNOSIS:  nuclear cataract right eye  PROCEDURE:  Procedure(s): CATARACT EXTRACTION PHACO AND INTRAOCULAR LENS PLACEMENT ([place])  SURGEON:  Surgeon(s): Williams Che, MD  ASSISTANTS: Cleda Clarks, CST   ANESTHESIA STAFF: Anesthesiologist: Josephine Igo, MD CRNA: Mickel Baas, CRNA  ANESTHESIA:   topical and MAC  REQUESTED LENS POWER: 20.5  LENS IMPLANT INFORMATION:   Alcon SN6AT4 20.5  @180  degrees  CUMULATIVE DISSIPATED ENERGY:3.27  INDICATIONS:see scanned office H&P for details  OP FINDINGS:dense NS  COMPLICATIONS:None  PROCEDURE:  The patient was brought to the operating room in good condition.   The zero/180 degree axis was marked with the patient upright.  The operative eye was prepped and draped in the usual fashion for intraocular surgery.  Lidocaine gel was dropped onto the eye.  A 2.4 mm 10 O'clock near clear corneal stepped incision and a 12 O'clock stab incision were created.  Viscoat was instilled into the anterior chamber.  The 5 mm anterior capsulorhexis was performed with a bent needle cystotome and Utrata forceps.  The lens was hydrodissected and hydrodelineated with a cannula and balanced salt solution and rotated with a Kuglen hook.  Phacoemulsification was perfomed in the divide and conquer technique.  The remaining cortex was removed with I&A and the capsular surfaces polished as necessary.  Provisc was placed into the capsular bag and the lens inserted with the Alcon inserter.  The lens was rotated into the premarked position and confirmed by the scrub nurse.   The viscoelastic was removed with I&A and the lens "rocked" into position prn.  The wounds were hydrated and te anterior chamber was refilled with balanced salt solution.  The wounds were checked for leakage and rehydrated as necessary.  The lid speculum and drapes were  removed and the patient was transported to short stay in good condition.  PATIENT DISPOSITION:  Short Stay

## 2016-08-01 NOTE — Discharge Instructions (Signed)

## 2016-08-04 ENCOUNTER — Encounter (HOSPITAL_COMMUNITY): Payer: Self-pay | Admitting: Ophthalmology

## 2017-01-13 ENCOUNTER — Encounter: Payer: Self-pay | Admitting: Internal Medicine

## 2018-02-08 ENCOUNTER — Encounter (INDEPENDENT_AMBULATORY_CARE_PROVIDER_SITE_OTHER): Payer: Self-pay | Admitting: Internal Medicine

## 2018-02-08 ENCOUNTER — Encounter (INDEPENDENT_AMBULATORY_CARE_PROVIDER_SITE_OTHER): Payer: Self-pay | Admitting: *Deleted

## 2018-02-08 ENCOUNTER — Telehealth (INDEPENDENT_AMBULATORY_CARE_PROVIDER_SITE_OTHER): Payer: Self-pay | Admitting: *Deleted

## 2018-02-08 ENCOUNTER — Ambulatory Visit (INDEPENDENT_AMBULATORY_CARE_PROVIDER_SITE_OTHER): Payer: Medicare Other | Admitting: Internal Medicine

## 2018-02-08 VITALS — BP 136/80 | HR 68 | Temp 97.6°F | Ht 70.0 in | Wt 211.9 lb

## 2018-02-08 DIAGNOSIS — D649 Anemia, unspecified: Secondary | ICD-10-CM | POA: Insufficient documentation

## 2018-02-08 DIAGNOSIS — Z860100 Personal history of colon polyps, unspecified: Secondary | ICD-10-CM | POA: Insufficient documentation

## 2018-02-08 DIAGNOSIS — D508 Other iron deficiency anemias: Secondary | ICD-10-CM | POA: Diagnosis not present

## 2018-02-08 DIAGNOSIS — D509 Iron deficiency anemia, unspecified: Secondary | ICD-10-CM

## 2018-02-08 HISTORY — DX: Iron deficiency anemia, unspecified: D50.9

## 2018-02-08 MED ORDER — PEG 3350-KCL-NA BICARB-NACL 420 G PO SOLR: 4000.0000 mL | Freq: Once | ORAL | 0 refills | Status: AC

## 2018-02-08 NOTE — Progress Notes (Signed)
Subjective:    Patient ID: Allen Hebert, male    DOB: [birth year], 77 y.o.   MRN: [id number]  HPI Referred by Dr. Johny Blamer for IDA. Last colonoscopy in 2014 for hx of colonic adenomas. Prep satisfactory. Three small polyps were removed using cold snare from hepatic flexure. Small polyp from sigmoid colon was ablated via cold biopsy. Small polyp from rectosigmoid junction was removed using cold snare. All of these polyps were submitted together. 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 Biopsy:  Patient had 5 small polyps removed and 2 are tubular adenomas Underwent lap chol in December, 2018. Apparently had episode of atrial fib during admission and was started on Eliquis He has no GI complaints. His appetite.  BMs are normal. No melena or BRRB  FOBT negative per records.    02/06/2018 H and H 13.3 and 40.6 01/12/2018 H and H 11.0 and 33.0, Iron 146, UIBC 253, TIBC 399, ferritin 24.5  Review of Systems  Past Medical History:  Diagnosis Date  . Anxiety   . Arthritis   . Depression   . GERD (gastroesophageal reflux disease)   . History of gout   . Hyperlipemia   . Hypertension   . Hypothyroidism   . IDA (iron deficiency anemia) 02/08/2018    Past Surgical History:  Procedure Laterality Date  . CATARACT EXTRACTION W/PHACO Right 08/01/2016   Procedure: CATARACT EXTRACTION PHACO AND INTRAOCULAR LENS PLACEMENT (IOC);  Surgeon: Williams Che, MD;  Location: [facility];  Service: Ophthalmology;  Laterality: Right;  CDE: 3.27  . COLONOSCOPY N/A 11/28/2013   Procedure: COLONOSCOPY;  Surgeon: Rogene Houston, MD;  Location: [facility];  Service: Endoscopy;  Laterality: N/A;  100  . JOINT REPLACEMENT Bilateral    hips  . right arm surg Right    elbow and arm has plates and screws  . right knee surg Right    Arthroscopy    Allergies  Allergen  Reactions  . Lipitor [Atorvastatin]     Current Outpatient Medications on File Prior to Visit  Medication Sig Dispense Refill  . allopurinol (ZYLOPRIM) 300 MG tablet Take 300 mg by mouth at bedtime.    Marland Kitchen allopurinol (ZYLOPRIM) 300 MG tablet Take 300 mg by mouth daily.    Marland Kitchen Apixaban (ELIQUIS PO) Take 5 mg by mouth.    . ferrous sulfate 325 (65 FE) MG tablet Take 65 mg by mouth 2 (two) times daily with a meal.    . levothyroxine (SYNTHROID, LEVOTHROID) 75 MCG tablet Take 75 mcg by mouth daily before breakfast.    . losartan (COZAAR) 25 MG tablet Take 25 mg by mouth daily. One in am and 1/2 tab in pm    . lovastatin (MEVACOR) 20 MG tablet Take 20 mg by mouth at bedtime.    . metoprolol succinate (TOPROL-XL) 25 MG 24 hr tablet Take 25 mg by mouth daily.    . Multiple Vitamin (MULTIVITAMIN) tablet Take 1 tablet by mouth daily.    . pantoprazole (PROTONIX) 40 MG tablet Take 40 mg by mouth daily.    Marland Kitchen PARoxetine (PAXIL) 20 MG tablet Take 20 mg by mouth daily.    Marland Kitchen zolpidem (AMBIEN) 5 MG tablet Take 5 mg by mouth at bedtime as needed for sleep.    Marland Kitchen acetaminophen (TYLENOL) 650 MG CR tablet Take 650 mg by mouth daily.    . cholecalciferol (VITAMIN D)  1000 UNITS tablet Take 1,000 Units by mouth daily.    . Coenzyme Q10 (COQ10) 100 MG CAPS Take 1 capsule by mouth daily.     No current facility-administered medications on file prior to visit.           Objective:   Physical Exam Blood pressure 136/80, pulse 68, temperature 97.6 F (36.4 C), height 5\' 10"  (1.778 m), weight 211 lb 14.4 oz (96.1 kg). Alert and oriented. Skin warm and dry. Oral mucosa is moist.   . Sclera anicteric, conjunctivae is pink. Thyroid not enlarged. No cervical lymphadenopathy. Lungs clear. Heart regular rate and rhythm.  Abdomen is soft. Bowel sounds are positive. No hepatomegaly. No abdominal masses felt. No tenderness.  No edema to lower extremities.           Assessment & Plan:  IDA. H and H are normal now.  Stool card was negative. Colonic polyps. Last colonoscopy in 2014. He is due for colonoscopy. The risks of bleeding, perforation and infection were reviewed with patient.

## 2018-02-08 NOTE — Patient Instructions (Signed)
The risks of bleeding, perforation and infection were reviewed with patient.  

## 2018-02-08 NOTE — Telephone Encounter (Signed)
Patient needs trilyte 

## 2018-02-12 ENCOUNTER — Telehealth (INDEPENDENT_AMBULATORY_CARE_PROVIDER_SITE_OTHER): Payer: Self-pay | Admitting: *Deleted

## 2018-02-12 NOTE — Telephone Encounter (Addendum)
Per Arbie Cookey with [facility] it is ok for patient to stop Eliquis 2 days before procedure sch'd 4/185/19, patient aware

## 2018-05-31 ENCOUNTER — Other Ambulatory Visit: Payer: Self-pay

## 2018-05-31 ENCOUNTER — Encounter (HOSPITAL_COMMUNITY): Payer: Self-pay | Admitting: *Deleted

## 2018-05-31 ENCOUNTER — Encounter (HOSPITAL_COMMUNITY)
Admission: RE | Disposition: A | Discharge status: Home / Self Care | Payer: Self-pay | Source: Ambulatory Visit | Attending: Internal Medicine

## 2018-05-31 ENCOUNTER — Ambulatory Visit (HOSPITAL_COMMUNITY)
Admission: RE | Admit: 2018-05-31 | Discharge: 2018-05-31 | Disposition: A | Discharge status: Home / Self Care | Payer: Medicare Other | Source: Ambulatory Visit | Attending: Internal Medicine | Admitting: Internal Medicine

## 2018-05-31 DIAGNOSIS — D508 Other iron deficiency anemias: Secondary | ICD-10-CM

## 2018-05-31 DIAGNOSIS — E039 Hypothyroidism, unspecified: Secondary | ICD-10-CM | POA: Diagnosis not present

## 2018-05-31 DIAGNOSIS — Z79899 Other long term (current) drug therapy: Secondary | ICD-10-CM | POA: Insufficient documentation

## 2018-05-31 DIAGNOSIS — Z862 Personal history of diseases of the blood and blood-forming organs and certain disorders involving the immune mechanism: Secondary | ICD-10-CM | POA: Diagnosis not present

## 2018-05-31 DIAGNOSIS — Z1211 Encounter for screening for malignant neoplasm of colon: Secondary | ICD-10-CM | POA: Insufficient documentation

## 2018-05-31 DIAGNOSIS — E785 Hyperlipidemia, unspecified: Secondary | ICD-10-CM | POA: Diagnosis not present

## 2018-05-31 DIAGNOSIS — I4891 Unspecified atrial fibrillation: Secondary | ICD-10-CM | POA: Diagnosis not present

## 2018-05-31 DIAGNOSIS — D509 Iron deficiency anemia, unspecified: Secondary | ICD-10-CM | POA: Diagnosis not present

## 2018-05-31 DIAGNOSIS — Z86010 Personal history of colonic polyps: Secondary | ICD-10-CM | POA: Diagnosis not present

## 2018-05-31 DIAGNOSIS — D649 Anemia, unspecified: Secondary | ICD-10-CM

## 2018-05-31 DIAGNOSIS — K621 Rectal polyp: Secondary | ICD-10-CM | POA: Insufficient documentation

## 2018-05-31 DIAGNOSIS — K5730 Diverticulosis of large intestine without perforation or abscess without bleeding: Secondary | ICD-10-CM | POA: Diagnosis not present

## 2018-05-31 DIAGNOSIS — K219 Gastro-esophageal reflux disease without esophagitis: Secondary | ICD-10-CM | POA: Insufficient documentation

## 2018-05-31 DIAGNOSIS — C183 Malignant neoplasm of hepatic flexure: Secondary | ICD-10-CM | POA: Diagnosis not present

## 2018-05-31 DIAGNOSIS — Z09 Encounter for follow-up examination after completed treatment for conditions other than malignant neoplasm: Secondary | ICD-10-CM | POA: Diagnosis not present

## 2018-05-31 DIAGNOSIS — Z9049 Acquired absence of other specified parts of digestive tract: Secondary | ICD-10-CM | POA: Insufficient documentation

## 2018-05-31 DIAGNOSIS — D125 Benign neoplasm of sigmoid colon: Secondary | ICD-10-CM | POA: Diagnosis not present

## 2018-05-31 DIAGNOSIS — I10 Essential (primary) hypertension: Secondary | ICD-10-CM | POA: Diagnosis not present

## 2018-05-31 DIAGNOSIS — M109 Gout, unspecified: Secondary | ICD-10-CM | POA: Diagnosis not present

## 2018-05-31 HISTORY — PX: POLYPECTOMY: SHX5525

## 2018-05-31 HISTORY — PX: COLONOSCOPY: SHX5424

## 2018-05-31 HISTORY — PX: BIOPSY: SHX5522

## 2018-05-31 SURGERY — COLONOSCOPY
Anesthesia: Moderate Sedation

## 2018-05-31 MED ORDER — STERILE WATER FOR IRRIGATION IR SOLN
Status: DC | PRN
  Administered 2018-05-31: 10:00:00

## 2018-05-31 MED ORDER — MIDAZOLAM HCL 5 MG/5ML IJ SOLN
INTRAMUSCULAR | Status: DC | PRN
  Administered 2018-05-31 (×2): 2 mg via INTRAVENOUS
  Administered 2018-05-31: 1 mg via INTRAVENOUS

## 2018-05-31 MED ORDER — SODIUM CHLORIDE 0.9 % IV SOLN
INTRAVENOUS | Status: DC
  Administered 2018-05-31: 10:00:00 via INTRAVENOUS

## 2018-05-31 MED ORDER — MEPERIDINE HCL 50 MG/ML IJ SOLN
INTRAMUSCULAR | Status: DC | PRN
  Administered 2018-05-31 (×2): 25 mg via INTRAVENOUS

## 2018-05-31 MED ORDER — MIDAZOLAM HCL 5 MG/5ML IJ SOLN
INTRAMUSCULAR | Status: AC
  Filled 2018-05-31: qty 10

## 2018-05-31 MED ORDER — MEPERIDINE HCL 50 MG/ML IJ SOLN
INTRAMUSCULAR | Status: AC
  Filled 2018-05-31: qty 1

## 2018-05-31 NOTE — H&P (Addendum)
Allen Hebert is an 77 y.o. male.   Chief Complaint: Patient is here for colonoscopy. HPI: Patient is 77 year old Caucasian male who has a history of colonic adenomas was found to have iron deficiency anemia in December 2018 when he had gallbladder surgery.  There is no history of melena or rectal bleeding and his stools are guaiac negative.  His anemia corrected with p.o. iron which he stopped recently.    He previously has been on medication for it. He was begun on Eliquis for A. fib that he developed when he had cholecystectomy. Family History is negative for CRC.  Past Medical History:  Diagnosis Date  . Anxiety   . Arthritis   . Atrial fibrillation ([place])   . Depression   . GERD (gastroesophageal reflux disease)   . History of gout   . Hyperlipemia   . Hypertension   . Hypothyroidism   . IDA (iron deficiency anemia) 02/08/2018    Past Surgical History:  Procedure Laterality Date  . CATARACT EXTRACTION W/PHACO Right 08/01/2016   Procedure: CATARACT EXTRACTION PHACO AND INTRAOCULAR LENS PLACEMENT (IOC);  Surgeon: Williams Che, MD;  Location: [facility];  Service: Ophthalmology;  Laterality: Right;  CDE: 3.27  . COLONOSCOPY N/A 11/28/2013   Procedure: COLONOSCOPY;  Surgeon: Rogene Houston, MD;  Location: [facility];  Service: Endoscopy;  Laterality: N/A;  100  . JOINT REPLACEMENT Bilateral    hips  . right arm surg Right    elbow and arm has plates and screws  . right knee surg Right    Arthroscopy    History reviewed. No pertinent family history. Social History:  reports that he has never smoked. He has never used smokeless tobacco. He reports that he drinks about 4.2 oz of alcohol per week. He reports that he does not use drugs.  Allergies:  Allergies  Allergen Reactions  . Lipitor [Atorvastatin] Other (See Comments)    Extreme weakness/fatigue    Medications Prior to Admission  Medication Sig Dispense Refill  . allopurinol (ZYLOPRIM) 300 MG tablet Take 300 mg by  mouth at bedtime.    Marland Kitchen ELIQUIS 5 MG TABS tablet Take 5 mg by mouth 2 (two) times daily.  11  . ferrous sulfate 325 (65 FE) MG tablet Take 325 mg by mouth 2 (two) times daily.     Marland Kitchen levothyroxine (SYNTHROID, LEVOTHROID) 75 MCG tablet Take 75 mcg by mouth daily before breakfast.    . losartan (COZAAR) 25 MG tablet Take 12.5-25 mg by mouth 2 (two) times daily. Take 1 tablet (25 mg) by mouth in the morning, & 0.5 tablet (12.5 mg) by mouth in the evening.    . lovastatin (MEVACOR) 20 MG tablet Take 20 mg by mouth at bedtime.    . metoprolol succinate (TOPROL-XL) 25 MG 24 hr tablet Take 25 mg by mouth daily.    . Multiple Vitamin (MULTIVITAMIN WITH MINERALS) TABS tablet Take 1 tablet by mouth daily.    . pantoprazole (PROTONIX) 40 MG tablet Take 40 mg by mouth daily.    Marland Kitchen PARoxetine (PAXIL) 20 MG tablet Take 20 mg by mouth at bedtime.     Marland Kitchen zolpidem (AMBIEN) 5 MG tablet Take 5 mg by mouth at bedtime.       No results found for this or any previous visit (from the past 48 hour(s)). No results found.  ROS  Blood pressure 131/65, pulse 63, temperature 97.7 F (36.5 C), temperature source Oral, resp. rate 14, SpO2 100 %.  Physical Exam  Constitutional: He appears well-developed and well-nourished.  HENT:  Mouth/Throat: Oropharynx is clear and moist.  Eyes: Conjunctivae are normal. No scleral icterus.  Neck: No thyromegaly present.  Cardiovascular: Normal rate, regular rhythm and normal heart sounds.  No murmur heard. Respiratory: Effort normal and breath sounds normal.  GI: Soft. He exhibits no distension and no mass. There is no tenderness.  Musculoskeletal: He exhibits no edema.  Lymphadenopathy:    He has no cervical adenopathy.  Neurological: He is alert.  Skin: Skin is warm and dry.     Assessment/Plan History of colonic adenomas. History of iron deficiency anemia(corrected). Surveillance colonoscopy.  Hildred Laser, MD 05/31/2018, 10:00 AM

## 2018-05-31 NOTE — Discharge Instructions (Signed)
Resume Eliquis on 06/03/2018. Resume other medications as before. High-fiber diet. No driving for 24 hours. Physician will call with biopsy results.  PATIENT INSTRUCTIONS POST-ANESTHESIA  IMMEDIATELY FOLLOWING SURGERY:  Do not drive or operate machinery for the first twenty four hours after surgery.  Do not make any important decisions for twenty four hours after surgery or while taking narcotic pain medications or sedatives.  If you develop intractable nausea and vomiting or a severe headache please notify your doctor immediately.  FOLLOW-UP:  Please make an appointment with your surgeon as instructed. You do not need to follow up with anesthesia unless specifically instructed to do so.  WOUND CARE INSTRUCTIONS (if applicable):  Keep a dry clean dressing on the anesthesia/puncture wound site if there is drainage.  Once the wound has quit draining you may leave it open to air.  Generally you should leave the bandage intact for twenty four hours unless there is drainage.  If the epidural site drains for more than 36-48 hours please call the anesthesia department.  QUESTIONS?:  Please feel free to call your physician or the hospital operator if you have any questions, and they will be happy to assist you.      Colonoscopy, Adult, Care After This sheet gives you information about how to care for yourself after your procedure. Your doctor may also give you more specific instructions. If you have problems or questions, call your doctor. Follow these instructions at home: General instructions   For the first 24 hours after the procedure: ? Do not drive or use machinery. ? Do not sign important documents. ? Do not drink alcohol. ? Do your daily activities more slowly than normal. ? Eat foods that are soft and easy to digest. ? Rest often.  Take over-the-counter or prescription medicines only as told by your doctor.  It is up to you to get the results of your procedure. Ask your doctor, or  the department performing the procedure, when your results will be ready. To help cramping and bloating:  Try walking around.  Put heat on your belly (abdomen) as told by your doctor. Use a heat source that your doctor recommends, such as a moist heat pack or a heating pad. ? Put a towel between your skin and the heat source. ? Leave the heat on for 20-30 minutes. ? Remove the heat if your skin turns bright red. This is especially important if you cannot feel pain, heat, or cold. You can get burned. Eating and drinking  Drink enough fluid to keep your pee (urine) clear or pale yellow.  Return to your normal diet as told by your doctor. Avoid heavy or fried foods that are hard to digest.  Avoid drinking alcohol for as long as told by your doctor. Contact a doctor if:  You have blood in your poop (stool) 2-3 days after the procedure. Get help right away if:  You have more than a small amount of blood in your poop.  You see large clumps of tissue (blood clots) in your poop.  Your belly is swollen.  You feel sick to your stomach (nauseous).  You throw up (vomit).  You have a fever.  You have belly pain that gets worse, and medicine does not help your pain. This information is not intended to replace advice given to you by your health care provider. Make sure you discuss any questions you have with your health care provider.   Diverticulosis Diverticulosis is a condition that develops when  small pouches (diverticula) form in the wall of the large intestine (colon). The colon is where water is absorbed and stool is formed. The pouches form when the inside layer of the colon pushes through weak spots in the outer layers of the colon. You may have a few pouches or many of them. What are the causes? The cause of this condition is not known. What increases the risk? The following factors may make you more likely to develop this condition:  Being older than age 33. Your risk for this  condition increases with age. Diverticulosis is rare among people younger than age 22. By age 34, many people have it.  Eating a low-fiber diet.  Having frequent constipation.  Being overweight.  Not getting enough exercise.  Smoking.  Taking over-the-counter pain medicines, like aspirin and ibuprofen.  Having a family history of diverticulosis.  What are the signs or symptoms? In most people, there are no symptoms of this condition. If you do have symptoms, they may include:  Bloating.  Cramps in the abdomen.  Constipation or diarrhea.  Pain in the lower left side of the abdomen.  How is this diagnosed? This condition is most often diagnosed during an exam for other colon problems. Because diverticulosis usually has no symptoms, it often cannot be diagnosed independently. This condition may be diagnosed by:  Using a flexible scope to examine the colon (colonoscopy).  Taking an X-ray of the colon after dye has been put into the colon (barium enema).  Doing a CT scan.  How is this treated? You may not need treatment for this condition if you have never developed an infection related to diverticulosis. If you have had an infection before, treatment may include:  Eating a high-fiber diet. This may include eating more fruits, vegetables, and grains.  Taking a fiber supplement.  Taking a live bacteria supplement (probiotic).  Taking medicine to relax your colon.  Taking antibiotic medicines.  Follow these instructions at home:  Drink 6-8 glasses of water or more each day to prevent constipation.  Try not to strain when you have a bowel movement.  If you have had an infection before: ? Eat more fiber as directed by your health care provider or your diet and nutrition specialist (dietitian). ? Take a fiber supplement or probiotic, if your health care provider approves.  Take over-the-counter and prescription medicines only as told by your health care  provider.  If you were prescribed an antibiotic, take it as told by your health care provider. Do not stop taking the antibiotic even if you start to feel better.  Keep all follow-up visits as told by your health care provider. This is important. Contact a health care provider if:  You have pain in your abdomen.  You have bloating.  You have cramps.  You have not had a bowel movement in 3 days. Get help right away if:  Your pain gets worse.  Your bloating becomes very bad.  You have a fever or chills, and your symptoms suddenly get worse.  You vomit.  You have bowel movements that are bloody or black.  You have bleeding from your rectum. Summary  Diverticulosis is a condition that develops when small pouches (diverticula) form in the wall of the large intestine (colon).  You may have a few pouches or many of them.  This condition is most often diagnosed during an exam for other colon problems.  If you have had an infection related to diverticulosis, treatment may include  increasing the fiber in your diet, taking supplements, or taking medicines. This information is not intended to replace advice given to you by your health care provider. Make sure you discuss any questions you have with your health care provider.    Colon Polyps Polyps are tissue growths inside the body. Polyps can grow in many places, including the large intestine (colon). A polyp may be a round bump or a mushroom-shaped growth. You could have one polyp or several. Most colon polyps are noncancerous (benign). However, some colon polyps can become cancerous over time. What are the causes? The exact cause of colon polyps is not known. What increases the risk? This condition is more likely to develop in people who:  Have a family history of colon cancer or colon polyps.  Are older than 41 or older than 45 if they are African American.  Have inflammatory bowel disease, such as ulcerative colitis or  Crohn disease.  Are overweight.  Smoke cigarettes.  Do not get enough exercise.  Drink too much alcohol.  Eat a diet that is: ? High in fat and red meat. ? Low in fiber.  Had childhood cancer that was treated with abdominal radiation.  What are the signs or symptoms? Most polyps do not cause symptoms. If you have symptoms, they may include:  Blood coming from your rectum when having a bowel movement.  Blood in your stool.The stool may look dark red or black.  A change in bowel habits, such as constipation or diarrhea.  How is this diagnosed? This condition is diagnosed with a colonoscopy. This is a procedure that uses a lighted, flexible scope to look at the inside of your colon. How is this treated? Treatment for this condition involves removing any polyps that are found. Those polyps will then be tested for cancer. If cancer is found, your health care provider will talk to you about options for colon cancer treatment. Follow these instructions at home: Diet  Eat plenty of fiber, such as fruits, vegetables, and whole grains.  Eat foods that are high in calcium and vitamin D, such as milk, cheese, yogurt, eggs, liver, fish, and broccoli.  Limit foods high in fat, red meats, and processed meats, such as hot dogs, sausage, bacon, and lunch meats.  Maintain a healthy weight, or lose weight if recommended by your health care provider. General instructions  Do not smoke cigarettes.  Do not drink alcohol excessively.  Keep all follow-up visits as told by your health care provider. This is important. This includes keeping regularly scheduled colonoscopies. Talk to your health care provider about when you need a colonoscopy.  Exercise every day or as told by your health care provider. Contact a health care provider if:  You have new or worsening bleeding during a bowel movement.  You have new or increased blood in your stool.  You have a change in bowel habits.  You  unexpectedly lose weight. This information is not intended to replace advice given to you by your health care provider. Make sure you discuss any questions you have with your health care provider.   High-Fiber Diet Fiber, also called dietary fiber, is a type of carbohydrate found in fruits, vegetables, whole grains, and beans. A high-fiber diet can have many health benefits. Your health care provider may recommend a high-fiber diet to help:  Prevent constipation. Fiber can make your bowel movements more regular.  Lower your cholesterol.  Relieve hemorrhoids, uncomplicated diverticulosis, or irritable bowel syndrome.  Prevent overeating  as part of a weight-loss plan.  Prevent heart disease, type 2 diabetes, and certain cancers.  What is my plan? The recommended daily intake of fiber includes:  38 grams for men under age 41.  2 grams for men over age 79.  76 grams for women under age 19.  64 grams for women over age 47.  You can get the recommended daily intake of dietary fiber by eating a variety of fruits, vegetables, grains, and beans. Your health care provider may also recommend a fiber supplement if it is not possible to get enough fiber through your diet. What do I need to know about a high-fiber diet?  Fiber supplements have not been widely studied for their effectiveness, so it is better to get fiber through food sources.  Always check the fiber content on thenutrition facts label of any prepackaged food. Look for foods that contain at least 5 grams of fiber per serving.  Ask your dietitian if you have questions about specific foods that are related to your condition, especially if those foods are not listed in the following section.  Increase your daily fiber consumption gradually. Increasing your intake of dietary fiber too quickly may cause bloating, cramping, or gas.  Drink plenty of water. Water helps you to digest fiber. What foods can I eat? Grains Whole-grain  breads. Multigrain cereal. Oats and oatmeal. Brown rice. Barley. Bulgur wheat. [place]. Bran muffins. Popcorn. Rye wafer crackers. Vegetables Sweet potatoes. Spinach. Kale. Artichokes. Cabbage. Broccoli. Green peas. Carrots. Squash. Fruits Berries. Pears. Apples. Oranges. Avocados. Prunes and raisins. Dried figs. Meats and Other Protein Sources [place], kidney, pinto, and soy beans. Split peas. Lentils. Nuts and seeds. Dairy Fiber-fortified yogurt. Beverages Fiber-fortified soy milk. Fiber-fortified orange juice. Other Fiber bars. The items listed above may not be a complete list of recommended foods or beverages. Contact your dietitian for more options. What foods are not recommended? Grains White bread. Pasta made with refined flour. White rice. Vegetables Fried potatoes. Canned vegetables. Well-cooked vegetables. Fruits Fruit juice. Cooked, strained fruit. Meats and Other Protein Sources Fatty cuts of meat. Fried Sales executive or fried fish. Dairy Milk. Yogurt. Cream cheese. Sour cream. Beverages Soft drinks. Other Cakes and pastries. Butter and oils. The items listed above may not be a complete list of foods and beverages to avoid. Contact your dietitian for more information. What are some tips for including high-fiber foods in my diet?  Eat a wide variety of high-fiber foods.  Make sure that half of all grains consumed each day are whole grains.  Replace breads and cereals made from refined flour or white flour with whole-grain breads and cereals.  Replace white rice with brown rice, bulgur wheat, or millet.  Start the day with a breakfast that is high in fiber, such as a cereal that contains at least 5 grams of fiber per serving.  Use beans in place of meat in soups, salads, or pasta.  Eat high-fiber snacks, such as berries, raw vegetables, nuts, or popcorn. This information is not intended to replace advice given to you by your health care provider. Make sure you discuss any  questions you have with your health care provider. Document Released: 11/28/2005 Document Revised: 05/05/2016 Document Reviewed: 05/13/2014 Elsevier Interactive Patient Education  [employer].

## 2018-05-31 NOTE — Op Note (Addendum)
[facility] Patient Name: Allen Hebert Procedure Date: 05/31/2018 9:23 AM MRN: [id number] Date of Birth: June 20, 1941 Attending MD: Hildred Laser , MD CSN: [id number] Age: 77 Admit Type: Outpatient Procedure:                Colonoscopy Indications:              High risk colon cancer surveillance: Personal                            history of colonic polyps, IDA. Providers:                Hildred Laser, MD, Jeanann Lewandowsky. Sharon Seller, RN, Randa Spike, Technician Referring MD:             Marijo Sanes, MD Medicines:                Meperidine 50 mg IV, Midazolam 6 mg IV Complications:            No immediate complications. Estimated Blood Loss:     Estimated blood loss was minimal. Procedure:                Pre-Anesthesia Assessment:                           - Prior to the procedure, a History and Physical                            was performed, and patient medications and                            allergies were reviewed. The patient's tolerance of                            previous anesthesia was also reviewed. The risks                            and benefits of the procedure and the sedation                            options and risks were discussed with the patient.                            All questions were answered, and informed consent                            was obtained. Prior Anticoagulants: The patient                            last took Eliquis (apixaban) 3 days prior to the                            procedure. ASA Grade Assessment: II - A patient  with mild systemic disease. After reviewing the                            risks and benefits, the patient was deemed in                            satisfactory condition to undergo the procedure.                           After obtaining informed consent, the colonoscope                            was passed under direct vision. Throughout the          procedure, the patient's blood pressure, pulse, and                            oxygen saturations were monitored continuously. The                            EC-[id number] ([id number]) scope was introduced through                            the anus and advanced to the the cecum, identified                            by appendiceal orifice and ileocecal valve. The                            colonoscopy was performed without difficulty. The                            patient tolerated the procedure well. The quality                            of the bowel preparation was good. Scope In: 10:11:16 AM Scope Out: 10:40:28 AM Scope Withdrawal Time: 0 hours 21 minutes 43 seconds  Total Procedure Duration: 0 hours 29 minutes 12 seconds  Findings:      The perianal and digital rectal examinations were normal.      A polypoid and ulcerated non-obstructing large mass was found at the       hepatic flexure. The mass was partially circumferential (involving       one-half of the lumen circumference). No bleeding was present. This was       biopsied with a cold forceps for histology. The pathology specimen was       placed into Bottle Number 2.      A small polyp was found in the proximal sigmoid colon. The polyp was       sessile. Biopsies were taken with a cold forceps for histology. The       pathology specimen was placed into Bottle Number 1.      Two sessile polyps were found in the rectum and proximal sigmoid colon.       The polyps were small in size. These polyps were removed with a cold  snare. Resection and retrieval were complete. The pathology specimen was       placed into Bottle Number 1.      A 6 mm polyp was found in the distal sigmoid colon. The polyp was       semi-pedunculated. The polyp was removed with a hot snare. Resection and       retrieval were complete. The pathology specimen was placed into Bottle       Number 1. To prevent bleeding after the polypectomy, one  hemostatic clip       was successfully placed (MR conditional). There was no bleeding during,       or at the end, of the procedure.      Scattered medium-mouthed diverticula were found in the sigmoid colon.      The retroflexed view of the distal rectum and anal verge was normal and       showed no anal or rectal abnormalities. 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 Biopsied.                           - Few small polyps proximal to mass were not                            removed.                           - One small polyp in the proximal sigmoid colon.                            Biopsied.                           - Two small polyps in the rectum and in the                            proximal sigmoid colon, removed with a cold snare.                            Resected and retrieved.                           - One 6 mm polyp in the distal sigmoid colon,                            removed with a hot snare. Resected and retrieved.                            Clip (MR conditional) was placed.                           - Diverticulosis in the sigmoid colon. Moderate Sedation:      Moderate (conscious) sedation was administered by the endoscopy nurse       and supervised by the endoscopist. The following parameters were       monitored: oxygen saturation, heart rate, blood pressure, CO2  capnography and response to care. Total physician intraservice time was       33 minutes. Recommendation:           - Patient has a contact number available for                            emergencies. The signs and symptoms of potential                            delayed complications were discussed with the                            patient. Return to normal activities tomorrow.                            Written discharge instructions were provided to the                            patient.                           - High fiber diet today.                           -  Continue present medications.                           - Resume Eliquis (apixaban) at prior dose in 3 days.                           - Await pathology results.                           - Repeat colonoscopy is recommended. The                            colonoscopy date will be determined after pathology                            results from today's exam become available for                            review. Procedure Code(s):        --- Professional ---                           [phone], Colonoscopy, flexible; with removal of                            tumor(s), polyp(s), or other lesion(s) by snare                            technique                           03546, 61, Colonoscopy, flexible; with biopsy,  single or multiple                           G0500, Moderate sedation services provided by the                            same physician or other qualified health care                            professional performing a gastrointestinal                            endoscopic service that sedation supports,                            requiring the presence of an independent trained                            observer to assist in the monitoring of the                            patient's level of consciousness and physiological                            status; initial 15 minutes of intra-service time;                            patient age 66 years or older (additional time may                            be reported with [phone], as appropriate)                           [phone], Moderate sedation services provided by the                            same physician or other qualified health care                            professional performing the diagnostic or                            therapeutic service that the sedation supports,                            requiring the presence of an independent trained                            observer to assist in  the monitoring of the                            patient's level of consciousness and physiological  status; each additional 15 minutes intraservice                            time (List separately in addition to code for                            primary service) Diagnosis Code(s):        --- Professional ---                           Z86.010, Personal history of colonic polyps                           C18.3, Malignant neoplasm of hepatic flexure                           D12.5, Benign neoplasm of sigmoid colon                           K62.1, Rectal polyp                           K57.30, Diverticulosis of large intestine without                            perforation or abscess without bleeding CPT copyright 2017 [employer]. All rights reserved. The codes documented in this report are preliminary and upon coder review may  be revised to meet current compliance requirements. Hildred Laser, MD Hildred Laser, MD 05/31/2018 11:08:28 AM This report has been signed electronically. Number of Addenda: 0

## 2018-06-04 ENCOUNTER — Encounter (HOSPITAL_COMMUNITY): Payer: Self-pay | Admitting: Internal Medicine

## 2018-06-19 ENCOUNTER — Encounter (HOSPITAL_COMMUNITY)
Admission: RE | Admit: 2018-06-19 | Discharge: 2018-06-19 | Disposition: A | Discharge status: Home / Self Care | Payer: Medicare Other | Source: Ambulatory Visit | Attending: General Surgery | Admitting: General Surgery

## 2018-06-19 ENCOUNTER — Encounter (HOSPITAL_COMMUNITY): Payer: Self-pay

## 2018-06-19 ENCOUNTER — Ambulatory Visit (INDEPENDENT_AMBULATORY_CARE_PROVIDER_SITE_OTHER): Payer: Medicare Other | Admitting: General Surgery

## 2018-06-19 ENCOUNTER — Encounter: Payer: Self-pay | Admitting: General Surgery

## 2018-06-19 ENCOUNTER — Ambulatory Visit (HOSPITAL_COMMUNITY)
Admission: RE | Admit: 2018-06-19 | Discharge: 2018-06-19 | Disposition: A | Discharge status: Home / Self Care | Payer: Medicare Other | Source: Ambulatory Visit | Attending: General Surgery | Admitting: General Surgery

## 2018-06-19 VITALS — BP 139/82 | HR 68 | Temp 98.0°F | Resp 18 | Ht 70.0 in | Wt 210.0 lb

## 2018-06-19 DIAGNOSIS — C183 Malignant neoplasm of hepatic flexure: Secondary | ICD-10-CM | POA: Diagnosis not present

## 2018-06-19 DIAGNOSIS — Z01818 Encounter for other preprocedural examination: Secondary | ICD-10-CM | POA: Diagnosis present

## 2018-06-19 DIAGNOSIS — I4891 Unspecified atrial fibrillation: Secondary | ICD-10-CM | POA: Diagnosis not present

## 2018-06-19 DIAGNOSIS — C189 Malignant neoplasm of colon, unspecified: Secondary | ICD-10-CM | POA: Diagnosis not present

## 2018-06-19 HISTORY — DX: Sleep apnea, unspecified: G47.30

## 2018-06-19 LAB — CBC WITH DIFFERENTIAL/PLATELET
BASOS ABS: 0 10*3/uL (ref 0.0–0.1)
Basophils Relative: 0 %
EOS ABS: 0.1 10*3/uL (ref 0.0–0.7)
EOS PCT: 2 %
HCT: 37.8 % — ABNORMAL LOW (ref 39.0–52.0)
HEMOGLOBIN: 12.2 g/dL — AB (ref 13.0–17.0)
Lymphocytes Relative: 12 %
Lymphs Abs: 1 10*3/uL (ref 0.7–4.0)
MCH: 30.1 pg (ref 26.0–34.0)
MCHC: 32.3 g/dL (ref 30.0–36.0)
MCV: 93.3 fL (ref 78.0–100.0)
Monocytes Absolute: 0.6 10*3/uL (ref 0.1–1.0)
Monocytes Relative: 7 %
NEUTROS PCT: 79 %
Neutro Abs: 6.5 10*3/uL (ref 1.7–7.7)
PLATELETS: 186 10*3/uL (ref 150–400)
RBC: 4.05 MIL/uL — AB (ref 4.22–5.81)
RDW: 14.5 % (ref 11.5–15.5)
WBC: 8.2 10*3/uL (ref 4.0–10.5)

## 2018-06-19 LAB — COMPREHENSIVE METABOLIC PANEL
ALBUMIN: 4 g/dL (ref 3.5–5.0)
ALK PHOS: 58 U/L (ref 38–126)
ALT: 18 U/L (ref 0–44)
AST: 22 U/L (ref 15–41)
Anion gap: 7 (ref 5–15)
BUN: 23 mg/dL (ref 8–23)
CALCIUM: 8.5 mg/dL — AB (ref 8.9–10.3)
CHLORIDE: 109 mmol/L (ref 98–111)
CO2: 24 mmol/L (ref 22–32)
CREATININE: 1.26 mg/dL — AB (ref 0.61–1.24)
GFR calc non Af Amer: 53 mL/min — ABNORMAL LOW (ref >60)
GLUCOSE: 110 mg/dL — AB (ref 70–99)
Potassium: 4.7 mmol/L (ref 3.5–5.1)
Sodium: 140 mmol/L (ref 135–145)
Total Bilirubin: 0.8 mg/dL (ref 0.3–1.2)
Total Protein: 6.8 g/dL (ref 6.5–8.1)

## 2018-06-19 LAB — PROTIME-INR
INR: 1.11
Prothrombin Time: 14.3 seconds (ref 11.4–15.2)

## 2018-06-19 LAB — ABO/RH: ABO/RH(D): O POS

## 2018-06-19 MED ORDER — NEOMYCIN SULFATE 500 MG PO TABS: 1000.0000 mg | ORAL_TABLET | ORAL | 0 refills | Status: DC

## 2018-06-19 MED ORDER — METRONIDAZOLE 500 MG PO TABS: 1000.0000 mg | ORAL_TABLET | ORAL | 0 refills | Status: DC

## 2018-06-19 NOTE — Patient Instructions (Signed)
Your procedure is scheduled on: 06/25/2018  Report to [facility] at   8:00  AM.  Call this number if you have problems the morning of surgery: [phone]   Remember:   Do not drink or eat food:After Midnight.  :  Take these medicines the morning of surgery with A SIP OF WATER: Levothyroxine, Metoprolol, and Paxil     Take pre op Neomycin and Flagyl as directed   Do not wear jewelry, make-up or nail polish.  Do not wear lotions, powders, or perfumes. You may wear deodorant.  Do not shave 48 hours prior to surgery. Men may shave face and neck.  Do not bring valuables to the hospital.  Contacts, dentures or bridgework may not be worn into surgery.  Leave suitcase in the car. After surgery it may be brought to your room.  For patients admitted to the hospital, checkout time is 11:00 AM the day of discharge.   Patients discharged the day of surgery will not be allowed to drive home.    Special Instructions: Shower using CHG night before surgery and shower the day of surgery use CHG.  Use special wash - you have one bottle of CHG for all showers.  You should use approximately 1/2 of the bottle for each shower.  Laparoscopic Colectomy, Care After This sheet gives you information about how to care for yourself after your procedure. Your health care provider may also give you more specific instructions. If you have problems or questions, contact your health care provider. What can I expect after the procedure? After your procedure, it is common to have the following:  Pain in your abdomen, especially in the incision areas. You will be given medicine to control the pain.  Tiredness. This is a normal part of the recovery process. Your energy level will return to normal over the next several weeks.  Changes in your bowel movements, such as constipation or needing to go more often. Talk with your health care provider about how to manage this.  Follow these instructions at  home: Medicines  Take over-the-counter and prescription medicines only as told by your health care provider.  Do not drive or use heavy machinery while taking prescription pain medicine.  Do not drink alcohol while taking prescription pain medicine.  If you were prescribed an antibiotic medicine, use it as told by your health care provider. Do not stop using the antibiotic even if you start to feel better. Incision care  Follow instructions from your health care provider about how to take care of your incision areas. Make sure you: ? Keep your incisions clean and dry. ? Wash your hands with soap and water before and after applying medicine to the areas, and before and after changing your bandage (dressing). If soap and water are not available, use hand sanitizer. ? Change your dressing as told by your health care provider. ? Leave stitches (sutures), skin glue, or adhesive strips in place. These skin closures may need to stay in place for 2 weeks or longer. If adhesive strip edges start to loosen and curl up, you may trim the loose edges. Do not remove adhesive strips completely unless your health care provider tells you to do that.  Do not wear tight clothing over the incisions. Tight clothing may rub and irritate the incision areas, which may cause the incisions to open.  Do not take baths, swim, or use a hot tub until your health care provider approves. Ask your health care provider  if you can take showers. You may only be allowed to take sponge baths for bathing.  Check your incision area every day for signs of infection. Check for: ? More redness, swelling, or pain. ? More fluid or blood. ? Warmth. ? Pus or a bad smell. Activity  Avoid lifting anything that is heavier than 10 lb (4.5 kg) for 2 weeks or until your health care provider says it is okay.  You may resume normal activities as told by your health care provider. Ask your health care provider what activities are safe for  you.  Take rest breaks during the day as needed. Eating and drinking  Follow instructions from your health care provider about what you can eat after surgery.  To prevent or treat constipation while you are taking prescription pain medicine, your health care provider may recommend that you: ? Drink enough fluid to keep your urine clear or pale yellow. ? Take over-the-counter or prescription medicines. ? Eat foods that are high in fiber, such as fresh fruits and vegetables, whole grains, and beans. ? Limit foods that are high in fat and processed sugars, such as fried and sweet foods. General instructions  Ask your health care provider when you will need an appointment to get your sutures or staples removed.  Keep all follow-up visits as told by your health care provider. This is important. Contact a health care provider if:  You have more redness, swelling, or pain around your incisions.    You have more fluid or blood coming from the incisions.  Your incisions feel warm to the touch.  You have pus or a bad smell coming from your incisions or your dressing.  You have a fever.  You have an incision that breaks open (edges not staying together) after sutures or staples have been removed. Get help right away if:  You develop a rash.  You have chest pain or difficulty breathing.  You have pain or swelling in your legs.  You feel light-headed or you faint.  Your abdomen swells (becomes distended).  You have nausea or vomiting.  You have blood in your stool (feces). This information is not intended to replace advice given to you by your health care provider. Make sure you discuss any questions you have with your health care provider. Document Released: 06/17/2005 Document Revised: 08/29/2016 Document Reviewed: 08/29/2016 Elsevier Interactive Patient Education  2018 [employer] [facility], Adult, Care After These instructions provide you with information about  caring for yourself after your procedure. Your health care provider may also give you more specific instructions. Your treatment has been planned according to current medical practices, but problems sometimes occur. Call your health care provider if you have any problems or questions after your procedure. What can I expect after the procedure? After the procedure, it is common to have:  Vomiting.  A sore throat.  Mental slowness.  It is common to feel:  Nauseous.  Cold or shivery.  Sleepy.  Tired.  Sore or achy, even in parts of your body where you did not have surgery.  Follow these instructions at home: For at least 24 hours after the procedure:  Do not: ? Participate in activities where you could fall or become injured. ? Drive. ? Use heavy machinery. ? Drink alcohol. ? Take sleeping pills or medicines that cause drowsiness. ? Make important decisions or sign legal documents. ? Take care of children on your own.  Rest. Eating and drinking  If you vomit, drink  water, juice, or soup when you can drink without vomiting.  Drink enough fluid to keep your urine clear or pale yellow.  Make sure you have little or no nausea before eating solid foods.  Follow the diet recommended by your health care provider. General instructions  Have a responsible adult stay with you until you are awake and alert.  Return to your normal activities as told by your health care provider. Ask your health care provider what activities are safe for you.  Take over-the-counter and prescription medicines only as told by your health care provider.  If you smoke, do not smoke without supervision.  Keep all follow-up visits as told by your health care provider. This is important. Contact a health care provider if:  You continue to have nausea or vomiting at home, and medicines are not helpful.  You cannot drink fluids or start eating again.  You cannot urinate after 8-12 hours.  You  develop a skin rash.  You have fever.  You have increasing redness at the site of your procedure. Get help right away if:  You have difficulty breathing.  You have chest pain.  You have unexpected bleeding.  You feel that you are having a life-threatening or urgent problem. This information is not intended to replace advice given to you by your health care provider. Make sure you discuss any questions you have with your health care provider. Document Released: 03/06/2001 Document Revised: 05/02/2016 Document Reviewed: 11/12/2015 Elsevier Interactive Patient Education  [employer].

## 2018-06-19 NOTE — Patient Instructions (Addendum)
Bowel Preparation Instructions: Buy from the Store: Miralax bottle (288g).  Gatorade 64 oz (not red). Dulcolax tablets.   The Day Prior to Surgery: Take 4 ducolax tablets at 7am with water. Drink plenty of clear liquids all day to avoid dehydration, no solid food.    Mix the bottle of Miralax and 64 oz of Gatorade and drink this mixture starting at 10am. Drink it gradually over the next few hours, 8 ounces every 15-30 minutes until it is gone. Finish this by 2pm.  Take 2 neomycin 500mg  tablets and 2 metronidazole 500mg  tablets at 2 pm. Take 2 neomycin 500mg  tablets and 2 metronidazole 500mg  tablets at 3pm. Take 2 neomycin 500mg  tablets and 2 metronidazole 500mg  tablets at 10pm.    Do not eat or drink anything after midnight the night before your surgery.  Do not eat or drink anything that morning, and take medications as instructed by the hospital staff on your preoperative visit.  Stop your Eliqius on Thursday evening. No Eliquis on Friday, Saturday, Sunday or Monday.   Laparoscopic Colectomy Laparoscopic colectomy is surgery to remove part or all of the large intestine (colon). This procedure may be used to treat several conditions, including:  Inflammation and infection of the colon (diverticulitis).  Tumors or masses in the colon.  Inflammatory bowel disease, such as Crohn disease or ulcerative colitis. Colectomy is an option when symptoms cannot be controlled with medicines.  Bleeding from the colon that cannot be controlled by another method.  Blockage or obstruction of the colon.  Tell a health care provider about:  Any allergies you have.  All medicines you are taking, including vitamins, herbs, eye drops, creams, and over-the-counter medicines.  Any problems you or family members have had with anesthetic medicines.  Any blood disorders you have.  Any surgeries you have had.  Any medical conditions you have. What are the risks? Generally, this is a safe  procedure. However, problems may occur, including:  Infection.  Bleeding.  Allergic reactions to medicines or dyes.  Damage to other structures or organs.  Leaking from where the colon was sewn together.  Future blockage of the small intestines from scar tissue. Another surgery may be needed to repair this.  Needing to convert to an open procedure. Complications such as damage to other organs or excessive bleeding may require the surgeon to convert from a laparoscopic procedure to an open procedure. This involves making a larger incision in the abdomen.  What happens before the procedure? Staying hydrated Follow instructions from your health care provider about hydration, which may include:  Up to 2 hours before the procedure - you may continue to drink clear liquids, such as water, clear fruit juice, black coffee, and plain tea.  Eating and drinking restrictions Follow instructions from your health care provider about eating and drinking, which may include:  8 hours before the procedure - stop eating heavy meals, meals with high fiber, or foods such as meat, fried foods, or fatty foods.  6 hours before the procedure - stop eating light meals or foods, such as toast or cereal.  6 hours before the procedure - stop drinking milk or drinks that contain milk.  2 hours before the procedure - stop drinking clear liquids.  Medicines  Ask your health care provider about: ? Changing or stopping your regular medicines. This is especially important if you are taking diabetes medicines or blood thinners. ? Taking medicines such as aspirin and ibuprofen. These medicines can thin your blood. Do  not take these medicines before your procedure if your health care provider instructs you not to.  You may be given antibiotic medicine to clean out bacteria from your colon. Follow the directions carefully and take the medicine at the correct time. General instructions  You may be prescribed an  oral bowel prep to clean out your colon in preparation for the surgery: ? Follow instructions from your health care provider about how to do this. ? Do not eat or drink anything else after you have started the bowel prep, unless your health care provider tells you it is safe to do so.  Do not use any products that contain nicotine or tobacco, such as cigarettes and e-cigarettes. If you need help quitting, ask your health care provider. What happens during the procedure?  To reduce your risk of infection: ? Your health care team will wash or sanitize their hands. ? Your skin will be washed with soap.  An IV tube will be inserted into one of your veins to deliver fluid and medication.  You will be given one of the following: ? A medicine to help you relax (sedative). ? A medicine to make you fall asleep (general anesthetic).  Small monitors will be connected to your body. They will be used to check your heart, blood pressure, and oxygen level.  A breathing tube may be placed into your lungs during the procedure.  A thin, flexible tube (catheter) will be placed into your bladder to drain urine.  A tube may be placed through your nose and into your stomach to drain stomach fluids (nasogastric tube, or NG tube).  Your abdomen will be filled with air so it expands. This gives the surgeon more room to operate and makes your organs easier to see.  Several small cuts (incisions) will be made in your abdomen.  A thin, lighted tube with a tiny camera on the end (laparoscope) will be put through one of the small incisions. The camera on the laparoscope will send a picture to a computer screen in the operating room. This will give the surgeon a good view inside your abdomen.  Hollow tubes will be put through the other small incisions in your abdomen. The tools that are needed for the procedure will be put through these tubes.  Clamps or staples will be put on both ends of the diseased part of the  colon.  The part of the intestine between the clamps or staples will be removed.  If possible, the ends of the healthy colon that remain will be stitched (sutured) or stapled together to allow your body to pass waste (stool).  Sometimes, the remaining colon cannot be stitched back together. If this is the case, a colostomy will be needed. If you need a colostomy: ? An opening to the outside of your body (stoma) will be made through your abdomen. ? The end of your colon will be brought to the opening. It will be stitched to the skin. ? A bag will be attached to the opening. Stool will drain into this removable bag. ? The colostomy may be temporary or permanent.  The incisions from the colectomy will be closed with sutures or staples. The procedure may vary among health care providers and hospitals. What happens after the procedure?  Your blood pressure, heart rate, breathing rate, and blood oxygen level will be monitored until the medicines you were given have worn off.  You will receive fluids through an IV tube until your bowels start  to work properly.  Once your bowels are working again, you will be given clear liquids first and then solid food as tolerated.  You will be given medicines to control your pain and nausea, if needed.  Do not drive for 24 hours if you were given a sedative. This information is not intended to replace advice given to you by your health care provider. Make sure you discuss any questions you have with your health care provider. Document Released: 02/18/2003 Document Revised: 08/29/2016 Document Reviewed: 08/29/2016 Elsevier Interactive Patient Education  2018 [employer]. Colorectal Cancer Colorectal cancer is an abnormal growth of cells and tissue (tumor) in the colon or rectum, which are parts of the large intestine. The cancer can spread (metastasize) to other parts of the body. What are the causes? Most cases of colorectal cancer start as abnormal  growths called polyps on the inner wall of the colon or rectum. Other times, abnormal changes to genes (genetic mutations) can cause cells to form cancer. What increases the risk? You are more likely to develop this condition if:  You are older than age 77.  You have multiple polyps in the colon or rectum.  You have diabetes.  You are African American.  You have a family history of Lynch syndrome.  You have had cancer before.  You have certain hereditary conditions, such as: ? Familial adenomatous polyposis. ? Turcot syndrome. ? Peutz-Jeghers syndrome.  You eat a diet that is high in fat (especially animal fat) and low in fiber, fruits, and vegetables.  You have an inactive (sedentary) lifestyle.  You have an inflammatory bowel disease or Crohn disease.  You smoke.  You drink alcohol excessively.  What are the signs or symptoms? Early colorectal cancer often does not cause symptoms. As the cancer grows, symptoms may include:  Changes in bowel habits.  Feeling like the bowel does not empty completely after a bowel movement.  Stools that are narrower than usual.  Blood in the stool.  Diarrhea.  Constipation.  Anemia.  Discomfort, pain, bloating, fullness, or cramps in the abdomen.  Frequent gas pain.  Unexplained weight loss.  Constant fatigue.  Nausea and vomiting.  How is this diagnosed? This condition may be diagnosed with:  A medical history.  A physical exam.  Tests. These may include: ? A digital rectal exam. ? A stool test called a fecal occult blood test. ? Blood tests. ? A test in which a tissue sample is taken from the colon or rectum and examined under a microscope (biopsy). ? Imaging tests, such as:  X-rays.  A barium enema.  CT scans.  MRIs.  A sigmoidoscopy. This test is done to view the inside of the rectum.  A colonoscopy. This test is done to view the inside of the colon. During this test, small polyps can be removed or  biopsies may be taken.  An endorectal ultrasound. This test checks how deep a tumor in the rectum has grown and whether the cancer has spread to lymph nodes or other nearby tissues.  Your health care provider may order additional tests to find out whether the cancer has spread to other parts of the body (what stage it is). The stages of cancer include:  Stage 0. At this stage, the cancer is found only in the innermost lining of the colon or rectum.  Stage I. At this stage, the cancer has grown into the inner wall of the colon or rectum.  Stage II. At this stage, the cancer  has gone more deeply into the wall of the colon or rectum or through the wall. It may have invaded nearby tissue.  Stage III. At this stage, the cancer has spread to nearby lymph nodes.  Stage IV. At this stage, the cancer has spread to other parts of the body, such as the liver or lungs.  How is this treated? Treatment for this condition depends on the type and stage of the cancer. Treatment may include:  Surgery. In the early stages of the cancer, surgery may be done to remove polyps or small tumors from the colon. In later stages, surgery may be done to remove part of the colon.  Chemotherapy. This treatment uses medicines to kill cancer cells.  Targeted therapy. This treatment targets specific gene mutations or proteins that the cancer expresses in order to kill tumor cells.  Radiation therapy. This treatment uses radiation to kill cancer cells or shrink tumors.  Radiofrequency ablation. This treatment uses radio waves to destroy the tumors that may have spread to other areas of the body, such as the liver.  Follow these instructions at home:  Take over-the-counter and prescription medicines only as told by your health care provider.  Try to eat regular, healthy meals. Some of your treatments might affect your appetite. If you are having problems eating or with your appetite, ask to meet with a food and  nutrition specialist (dietitian).  Consider joining a support group. This may help you learn about your diagnosis and cope with the stress of having colorectal cancer.  If you are admitted to the hospital, inform your cancer care team.  Keep all follow-up visits as told by your health care provider. This is important. How is this prevented?  Colorectal cancer can be prevented with screening tests that find polyps so they can be removed before they develop into cancer.  Most people with average risk of colorectal cancer should start screening at age 76. People at increased risk should start screening at an earlier age. Where to find more information:  American Cancer Society: https://www.cancer.Camp ([place]): https://www.cancer.gov Contact a health care provider if:  Your diarrhea or constipation does not go away.  You have blood in your stool.  Your bowel habits change.  You have increased pain in your abdomen.  You notice new fatigue or weakness.  You lose weight. Get help right away if:  You have increased bleeding from your rectum.  You have any uncontrollable or severe abdomen (abdominal) symptoms. Summary  Colorectal cancer is an abnormal growth of cells and tissue (tumor) in the colon or rectum.  Common risk factors for this condition include having a relative with colon cancer, being older in age, having an inflammatory bowel disease, and being African American.  This condition may be diagnosed with tests, such as a colonoscopy and biopsy.  Treatment depends on the type and stage of the cancer. Commonly, treatment includes surgery to remove the tumor along with chemotherapy or targeted therapy.  Keep all follow-up visits as told by your health care provider. This is important. This information is not intended to replace advice given to you by your health care provider. Make sure you discuss any questions you have with your health care  provider. Document Released: 11/28/2005 Document Revised: 12/30/2016 Document Reviewed: 12/30/2016 Elsevier Interactive Patient Education  [employer].

## 2018-06-19 NOTE — H&P (Signed)
[facility] History and Physical  Reason for Referral: Colon cancer  Referring Physician: Dr. Laural Golden      Chief Complaint    Colon Cancer     Allen Hebert is a 77 y.o. male.  HPI: Allen Hebert is a 77 yo who has been otherwise relatively healthy but was noted to have some anemia back in fall/ winter of last year.  He was also having some RUQ pain and was seen in the ED at [facility] around the same time, and underwent a laparoscopic cholecystectomy without issues. He did develop A fib prior to his discharge, and was started on Eliquis due to the arrhythmia. He has been seen by Dr. Tobe Sos cardiology at [facility], and is going to stay on this medication.  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  He denies any weight loss or fatigue. He said he had some darker stools with the iron intake but nothing really prior to this medication.   He has had some hematuria and was referred to Urology and had a cystoscopy done and a CT a/p at [facility]. Both of these were unrevealing per Care Everywhere, and he was placed on Flomax for BPH.  He denies any changes in his stools or caliber of his stools.    Dr. Laural Golden- Colonoscopy 05/2018 - Malignant tumor at the hepatic flexure. Biopsied. - Few small polyps proximal to mass were not removed. - One small polyp in the proximal sigmoid colon. Biopsied. - Two small polyps in the rectum and in the proximal sigmoid colon, removed with a cold snare. Resected and retrieved. - One 6 mm polyp in the distal sigmoid colon, removed with a hot snare. Resected and retrieved. Clip (MR conditional) was placed. - Diverticulosis in the sigmoid colon.      Past Medical History:  Diagnosis Date  . Anxiety   . Arthritis   . Atrial fibrillation ([place])   . Depression   . GERD (gastroesophageal reflux disease)   . History of gout   . Hyperlipemia   .  Hypertension   . Hypothyroidism   . IDA (iron deficiency anemia) 02/08/2018         Past Surgical History:  Procedure Laterality Date  . BIOPSY  05/31/2018   Procedure: BIOPSY;  Surgeon: Rogene Houston, MD;  [facility];  Service: Endoscopy;;  hepatic flexure  . CATARACT EXTRACTION W/PHACO Right 08/01/2016   Procedure: CATARACT EXTRACTION PHACO AND INTRAOCULAR LENS PLACEMENT (IOC);  Surgeon: Williams Che, MD;  Location: [facility] ORS;  Service: Ophthalmology;  Laterality: Right;  CDE: 3.27  . COLONOSCOPY N/A 11/28/2013   Procedure: COLONOSCOPY;  Surgeon: Rogene Houston, MD;  [facility]: [facility];  Service: Endoscopy;  Laterality: N/A;  100  . COLONOSCOPY N/A 05/31/2018   Procedure: COLONOSCOPY;  Surgeon: Rogene Houston, MD;  [facility];  Service: Endoscopy;  Laterality: N/A;  2:30-rescheduled to 6/20 @ 10:30am per Lelon Frohlich  . JOINT REPLACEMENT Bilateral    hips  . POLYPECTOMY  05/31/2018   Procedure: POLYPECTOMY;  Surgeon: Rogene Houston, MD;  [facility];  Service: Endoscopy;;  colon   . right arm surg Right    elbow and arm has plates and screws  . right knee surg Right    Arthroscopy   No CRC in the family reported  Family History  Problem Relation Age of Onset  . Diabetes Mother   . Parkinson's disease Mother   . Hypertension Father   . Heart disease Father     Social History        Tobacco Use  . Smoking status: Never Smoker  . Smokeless tobacco: Never Used  Substance Use Topics  . Alcohol use: Yes    Alcohol/week: 4.2 oz    Types: 7 Glasses of wine per week  . Drug use: No    Medications: I have reviewed the patient's current medications.      Allergies as of 06/19/2018      Reactions   Lipitor [atorvastatin] Other (See Comments)   Extreme weakness/fatigue          Medication List            Accurate as of 06/19/18 12:55 PM. Always use your most recent med list.             allopurinol 300 MG tablet Commonly known as:  ZYLOPRIM Take 300 mg by mouth at bedtime.   ELIQUIS 5 MG Tabs tablet Generic drug:  apixaban Take 1 tablet (5 mg total) by mouth 2 (two) times daily.   ferrous sulfate 325 (65 FE) MG tablet Take 325 mg by mouth 2 (two) times daily.   levothyroxine 75 MCG tablet Commonly known as:  SYNTHROID, LEVOTHROID Take 75 mcg by mouth daily before breakfast.   losartan 25 MG tablet Commonly known as:  COZAAR Take 12.5-25 mg by mouth 2 (two) times daily. Take 1 tablet (25 mg) by mouth in the morning, & 0.5 tablet (12.5 mg) by mouth in the evening.   lovastatin 20 MG tablet Commonly known as:  MEVACOR Take 20 mg by mouth at bedtime.   metoprolol succinate 25 MG 24 hr tablet Commonly known as:  TOPROL-XL Take 25 mg by mouth daily.   metroNIDAZOLE 500 MG tablet Commonly known as:  FLAGYL Take 2 tablets (1,000 mg total) by mouth as directed. Take 2 flagyl 500mg  tablets 2 pm, 3pm, 10pm.   multivitamin with minerals Tabs tablet Take 1 tablet by mouth daily.   neomycin 500 MG tablet Commonly known as:  MYCIFRADIN Take 2 tablets (1,000 mg total) by mouth as directed. Take 2 neomycin 500mg  tablets 2 pm, 3pm, 10pm.   PARoxetine 20 MG tablet Commonly known as:  PAXIL Take 20 mg by mouth at bedtime.   PROTONIX 40 MG tablet Generic drug:  pantoprazole Take 40 mg by mouth daily.   zolpidem 5 MG tablet Commonly known as:  AMBIEN Take 5 mg by mouth at bedtime.        ROS:  A comprehensive review of systems was negative except for: Cardiovascular: positive for HTN, A fib on eliqius Gastrointestinal: positive for reflux symptoms Genitourinary: positive for frequency and retention, started on flomax Musculoskeletal: positive for neck pain and stiff joints  Blood pressure 139/82, pulse 68, temperature 98 F (36.7 C), resp. rate 18, height 5\' 10"  (1.778 m), weight 210 lb (95.3 kg). Physical Exam  Constitutional: He is  oriented to person, place, and time. He appears well-developed and well-nourished.  HENT:  Head: Normocephalic and atraumatic.  Eyes: Pupils are equal, round, and reactive to light.  Neck: Normal range of motion. Neck supple.  Cardiovascular: Normal rate and regular rhythm.  Pulmonary/Chest: Effort normal and breath sounds normal.  Abdominal: Soft. He exhibits no distension. There is no tenderness.  Fullness in RUQ, no obvious mass identified  Musculoskeletal: Normal range of motion. He exhibits no edema.  Neurological: He is alert and oriented to person, place, and time.  Skin: Skin is warm and dry.  Psychiatric: He has a normal mood and affect. His behavior is normal. Judgment and thought content normal.  Vitals reviewed.   Results: CT a/p [facility] 05/2018 - ABDOMEN/PELVIS:  Liver: Within normal limits. Gallbladder/Biliary: Gallbladder not visualized.. Pancreas: Within normal limits. Spleen: Within normal limits. Adrenals: Within normal limits. Kidneys/ureters/bladder: No renal stones. No hydroureteronephrosis. Subcentimeter right renal hypodensity is too small to fully characterize. Posterior bladder diverticula. An additional lateral diverticulum is noted. No evidence of bladder wall thickening. No discrete masses are identified. Peritoneum/Mesenteries: Within normal limits. Extraperitoneum: Within normal limits. Gastrointestinal tract: Colonic diverticula without associated inflammatory change.. Reproductive System: Within normal limits. Vascular: Within normal limits.  MSK: Bilateral hip arthroplasty. Degenerative changes of the lumbar spine. No suspicious osseous lesions.  IMPRESSION: No CT correlate for hematuria. Please note that this evaluation does not obviate the need for cystoscopy in the setting of unexplained Hematuria.  Pathology: Diagnosis 1. Colon, polyp(s), proximal sigmoid, distal sigmoid, rectal - TUBULAR ADENOMA(S) - NEGATIVE FOR HIGH GRADE DYSPLASIA  OR MALIGNANCY. 2. Colon, biopsy, hepatic flexure - ADENOCARCINOMA, SEE NOTE.   Assessment & Plan:  ABDOULAYE DRUM is a 77 y.o. 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 He is otherwise relatively healthy and has been active in the past. He is now being scheduled for a laparoscopic right hemicolectomy possible open. He had a CT scan 05/2018 without any concerning findings, livers mets that were seen.  He has had a history of A fib in 11/2017, but spontaneously converted. He is otherwise asymptomatic from a cardiology standpoint, and has no CP or SOB complaints. He underwent general anesthesia for his gallbladder surgery without issues 11/2017 prior to the onset of A fib.    -OR for laparoscopic right hemicolectomy possible open -Bowel preparation discussed -Consent for blood and surgery discussed   Buy from the Store: Miralax bottle (288g).  Gatorade 64 oz (not red). Dulcolax tablets.   The Day Prior to Surgery: Take 4 ducolax tablets at 7am with water. Drink plenty of clear liquids all day to avoid dehydration, no solid food.   Mix thebottle of Miralax and 64 oz of Gatorade and drink this mixture starting at 10am. Drinkit gradually over the next few hours,8 ounces every 15-30 minutes until it is gone. Finish this by 2pm.  Take 2 neomycin 500mg  tablets and 2 metronidazole 500mg  tablets at 2 pm. Take 2 neomycin 500mg  tablets and 2 metronidazole 500mg  tablets at 3pm. Take 2 neomycin 500mg  tablets and 2 metronidazole 500mg  tablets at 10pm.   Do not eat or drink anything after midnight the night before your surgery.  Do not eat or drink anything that morning, and take medications as instructed by the hospital staff on your preoperative visit.   All questions were answered to the satisfaction of the patient and family.  The risk and benefits of laparoscopic right hemicolectomy possible open were discussed including but not limited to bleeding, infection, leak,  injury to the ureter, need for additional surgery, need for an open surgery, risk of finding spread of the cancer, possible need for chemotherapy.  After careful consideration, Allen Hebert has decided to proceed.    Virl Cagey 06/19/2018, 12:55 PM

## 2018-06-19 NOTE — Progress Notes (Addendum)
[facility] History and Physical  Reason for Referral: Colon cancer  Referring Physician: Dr. Laural Golden   Chief Complaint    Colon Cancer     Allen Hebert is a 77 y.o. male.  HPI: Allen Hebert is a 77 yo who has been otherwise relatively healthy but was noted to have some anemia back in fall/ winter of last year.  He was also having some RUQ pain and was seen in the ED at [facility] around the same time, and underwent a laparoscopic cholecystectomy without issues. He did develop A fib prior to his discharge, and was started on Eliquis due to the arrhythmia. He has been seen by Dr. Tobe Sos cardiology at [facility], and is going to stay on this medication.  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  He denies any weight loss or fatigue. He said he had some darker stools with the iron intake but nothing really prior to this medication.   He has had some hematuria and was referred to Urology and had a cystoscopy done and a CT a/p at [facility]. Both of these were unrevealing per Care Everywhere, and he was placed on Flomax for BPH.  He denies any changes in his stools or caliber of his stools.    Dr. Laural Golden- Colonoscopy 05/2018 - Malignant tumor at the hepatic flexure. Biopsied. - Few small polyps proximal to mass were not removed. - One small polyp in the proximal sigmoid colon. Biopsied. - Two small polyps in the rectum and in the proximal sigmoid colon, removed with a cold snare. Resected and retrieved. - One 6 mm polyp in the distal sigmoid colon, removed with a hot snare. Resected and retrieved. Clip (MR conditional) was placed. - Diverticulosis in the sigmoid colon.  Past Medical History:  Diagnosis Date  . Anxiety   . Arthritis   . Atrial fibrillation ([place])   . Depression   . GERD (gastroesophageal reflux disease)   . History of gout   . Hyperlipemia   . Hypertension   .  Hypothyroidism   . IDA (iron deficiency anemia) 02/08/2018    Past Surgical History:  Procedure Laterality Date  . BIOPSY  05/31/2018   Procedure: BIOPSY;  Surgeon: Rogene Houston, MD;  [facility];  Service: Endoscopy;;  hepatic flexure  . CATARACT EXTRACTION W/PHACO Right 08/01/2016   Procedure: CATARACT EXTRACTION PHACO AND INTRAOCULAR LENS PLACEMENT (IOC);  Surgeon: Williams Che, MD;  Location: [facility];  Service: Ophthalmology;  Laterality: Right;  CDE: 3.27  . COLONOSCOPY N/A 11/28/2013   Procedure: COLONOSCOPY;  Surgeon: Rogene Houston, MD;  [facility];  Service: Endoscopy;  Laterality: N/A;  100  . COLONOSCOPY N/A 05/31/2018   Procedure: COLONOSCOPY;  Surgeon: Rogene Houston, MD;  [facility];  Service: Endoscopy;  Laterality: N/A;  2:30-rescheduled to 6/20 @ 10:30am per Lelon Frohlich  . JOINT REPLACEMENT Bilateral    hips  . POLYPECTOMY  05/31/2018   Procedure: POLYPECTOMY;  Surgeon: Rogene Houston, MD;  [facility];  Service: Endoscopy;;  colon   . right arm surg Right    elbow and arm has plates and screws  . right knee surg Right    Arthroscopy   No CRC in the family reported  Family History  Problem Relation Age of Onset  . Diabetes Mother   . Parkinson's disease  Mother   . Hypertension Father   . Heart disease Father     Social History   Tobacco Use  . Smoking status: Never Smoker  . Smokeless tobacco: Never Used  Substance Use Topics  . Alcohol use: Yes    Alcohol/week: 4.2 oz    Types: 7 Glasses of wine per week  . Drug use: No    Medications: I have reviewed the patient's current medications. Allergies as of 06/19/2018      Reactions   Lipitor [atorvastatin] Other (See Comments)   Extreme weakness/fatigue      Medication List        Accurate as of 06/19/18 12:55 PM. Always use your most recent med list.          allopurinol 300 MG tablet Commonly known as:  ZYLOPRIM Take 300 mg by mouth at bedtime.    ELIQUIS 5 MG Tabs tablet Generic drug:  apixaban Take 1 tablet (5 mg total) by mouth 2 (two) times daily.   ferrous sulfate 325 (65 FE) MG tablet Take 325 mg by mouth 2 (two) times daily.   levothyroxine 75 MCG tablet Commonly known as:  SYNTHROID, LEVOTHROID Take 75 mcg by mouth daily before breakfast.   losartan 25 MG tablet Commonly known as:  COZAAR Take 12.5-25 mg by mouth 2 (two) times daily. Take 1 tablet (25 mg) by mouth in the morning, & 0.5 tablet (12.5 mg) by mouth in the evening.   lovastatin 20 MG tablet Commonly known as:  MEVACOR Take 20 mg by mouth at bedtime.   metoprolol succinate 25 MG 24 hr tablet Commonly known as:  TOPROL-XL Take 25 mg by mouth daily.   metroNIDAZOLE 500 MG tablet Commonly known as:  FLAGYL Take 2 tablets (1,000 mg total) by mouth as directed. Take 2 flagyl 500mg  tablets 2 pm, 3pm, 10pm.   multivitamin with minerals Tabs tablet Take 1 tablet by mouth daily.   neomycin 500 MG tablet Commonly known as:  MYCIFRADIN Take 2 tablets (1,000 mg total) by mouth as directed. Take 2 neomycin 500mg  tablets 2 pm, 3pm, 10pm.   PARoxetine 20 MG tablet Commonly known as:  PAXIL Take 20 mg by mouth at bedtime.   PROTONIX 40 MG tablet Generic drug:  pantoprazole Take 40 mg by mouth daily.   zolpidem 5 MG tablet Commonly known as:  AMBIEN Take 5 mg by mouth at bedtime.        ROS:  A comprehensive review of systems was negative except for: Cardiovascular: positive for HTN, A fib on eliqius Gastrointestinal: positive for reflux symptoms Genitourinary: positive for frequency and retention, started on flomax Musculoskeletal: positive for neck pain and stiff joints  Blood pressure 139/82, pulse 68, temperature 98 F (36.7 C), resp. rate 18, height 5\' 10"  (1.778 m), weight 210 lb (95.3 kg). Physical Exam  Constitutional: He is oriented to person, place, and time. He appears well-developed and well-nourished.  HENT:  Head: Normocephalic and  atraumatic.  Eyes: Pupils are equal, round, and reactive to light.  Neck: Normal range of motion. Neck supple.  Cardiovascular: Normal rate and regular rhythm.  Pulmonary/Chest: Effort normal and breath sounds normal.  Abdominal: Soft. He exhibits no distension. There is no tenderness.  Fullness in RUQ, no obvious mass identified  Musculoskeletal: Normal range of motion. He exhibits no edema.  Neurological: He is alert and oriented to person, place, and time.  Skin: Skin is warm and dry.  Psychiatric: He has a normal mood and affect.  His behavior is normal. Judgment and thought content normal.  Vitals reviewed.   Results: CT a/p [facility] 05/2018 - ABDOMEN/PELVIS:  Liver: Within normal limits. Gallbladder/Biliary: Gallbladder not visualized.. Pancreas: Within normal limits. Spleen: Within normal limits. Adrenals: Within normal limits. Kidneys/ureters/bladder: No renal stones. No hydroureteronephrosis. Subcentimeter right renal hypodensity is too small to fully characterize. Posterior bladder diverticula. An additional lateral diverticulum is noted. No evidence of bladder wall thickening. No discrete masses are identified. Peritoneum/Mesenteries: Within normal limits. Extraperitoneum: Within normal limits. Gastrointestinal tract: Colonic diverticula without associated inflammatory change.. Reproductive System: Within normal limits. Vascular: Within normal limits.  MSK: Bilateral hip arthroplasty. Degenerative changes of the lumbar spine. No suspicious osseous lesions.  IMPRESSION: No CT correlate for hematuria. Please note that this evaluation does not obviate the need for cystoscopy in the setting of unexplained Hematuria.  Pathology: Diagnosis 1. Colon, polyp(s), proximal sigmoid, distal sigmoid, rectal - TUBULAR ADENOMA(S) - NEGATIVE FOR HIGH GRADE DYSPLASIA OR MALIGNANCY. 2. Colon, biopsy, hepatic flexure - ADENOCARCINOMA, SEE NOTE.   Assessment & Plan:  Allen Hebert  is a 77 y.o. 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 He is otherwise relatively healthy and has been active in the past. He is now being scheduled for a laparoscopic right hemicolectomy possible open. He had a CT scan 05/2018 without any concerning findings, livers mets that were seen.  He has had a history of A fib in 11/2017, but spontaneously converted. He is otherwise asymptomatic from a cardiology standpoint, and has no CP or SOB complaints. He underwent general anesthesia for his gallbladder surgery without issues 11/2017 prior to the onset of A fib.    -OR for laparoscopic right hemicolectomy possible open -Bowel preparation discussed -Consent for blood and surgery discussed  -A fib on Eliquis - hold for 3 days prior to surgery, will restart following operation once we know H&H stable   Buy from the Store: Miralax bottle (288g).  Gatorade 64 oz (not red). Dulcolax tablets.   The Day Prior to Surgery: Take 4 ducolax tablets at 7am with water. Drink plenty of clear liquids all day to avoid dehydration, no solid food.    Mix the bottle of Miralax and 64 oz of Gatorade and drink this mixture starting at 10am. Drink it gradually over the next few hours, 8 ounces every 15-30 minutes until it is gone. Finish this by 2pm.  Take 2 neomycin 500mg  tablets and 2 metronidazole 500mg  tablets at 2 pm. Take 2 neomycin 500mg  tablets and 2 metronidazole 500mg  tablets at 3pm. Take 2 neomycin 500mg  tablets and 2 metronidazole 500mg  tablets at 10pm.    Do not eat or drink anything after midnight the night before your surgery.  Do not eat or drink anything that morning, and take medications as instructed by the hospital staff on your preoperative visit.    All questions were answered to the satisfaction of the patient and family.  The risk and benefits of laparoscopic right hemicolectomy possible open were discussed including but not limited to bleeding, infection, leak, injury to the  ureter, need for additional surgery, need for an open surgery, risk of finding spread of the cancer, possible need for chemotherapy.  After careful consideration, DASHEL GOINES has decided to proceed.    Virl Cagey 06/19/2018, 12:55 PM

## 2018-06-20 LAB — CEA: CEA: 28.4 ng/mL — ABNORMAL HIGH (ref 0.0–4.7)

## 2018-06-20 LAB — PREPARE RBC (CROSSMATCH)

## 2018-06-25 ENCOUNTER — Inpatient Hospital Stay (HOSPITAL_COMMUNITY)
Admission: RE | Admit: 2018-06-25 | Discharge: 2018-06-28 | DRG: 330 | Disposition: A | Discharge status: Home / Self Care | Payer: Medicare Other | Source: Ambulatory Visit | Attending: General Surgery | Admitting: General Surgery

## 2018-06-25 ENCOUNTER — Encounter (HOSPITAL_COMMUNITY)
Admission: RE | Disposition: A | Discharge status: Home / Self Care | Payer: Self-pay | Source: Ambulatory Visit | Attending: General Surgery

## 2018-06-25 ENCOUNTER — Inpatient Hospital Stay (HOSPITAL_COMMUNITY): Payer: Medicare Other | Admitting: Anesthesiology

## 2018-06-25 ENCOUNTER — Other Ambulatory Visit: Payer: Self-pay

## 2018-06-25 ENCOUNTER — Encounter (HOSPITAL_COMMUNITY): Payer: Self-pay | Admitting: *Deleted

## 2018-06-25 DIAGNOSIS — Z7901 Long term (current) use of anticoagulants: Secondary | ICD-10-CM

## 2018-06-25 DIAGNOSIS — K5730 Diverticulosis of large intestine without perforation or abscess without bleeding: Secondary | ICD-10-CM | POA: Diagnosis present

## 2018-06-25 DIAGNOSIS — D649 Anemia, unspecified: Secondary | ICD-10-CM | POA: Diagnosis present

## 2018-06-25 DIAGNOSIS — Z888 Allergy status to other drugs, medicaments and biological substances status: Secondary | ICD-10-CM | POA: Diagnosis not present

## 2018-06-25 DIAGNOSIS — Z96643 Presence of artificial hip joint, bilateral: Secondary | ICD-10-CM | POA: Diagnosis present

## 2018-06-25 DIAGNOSIS — M109 Gout, unspecified: Secondary | ICD-10-CM | POA: Diagnosis present

## 2018-06-25 DIAGNOSIS — E039 Hypothyroidism, unspecified: Secondary | ICD-10-CM | POA: Diagnosis present

## 2018-06-25 DIAGNOSIS — Z79899 Other long term (current) drug therapy: Secondary | ICD-10-CM | POA: Diagnosis not present

## 2018-06-25 DIAGNOSIS — M1990 Unspecified osteoarthritis, unspecified site: Secondary | ICD-10-CM | POA: Diagnosis present

## 2018-06-25 DIAGNOSIS — E785 Hyperlipidemia, unspecified: Secondary | ICD-10-CM | POA: Diagnosis present

## 2018-06-25 DIAGNOSIS — N400 Enlarged prostate without lower urinary tract symptoms: Secondary | ICD-10-CM | POA: Diagnosis present

## 2018-06-25 DIAGNOSIS — I10 Essential (primary) hypertension: Secondary | ICD-10-CM | POA: Diagnosis present

## 2018-06-25 DIAGNOSIS — C183 Malignant neoplasm of hepatic flexure: Secondary | ICD-10-CM | POA: Diagnosis present

## 2018-06-25 DIAGNOSIS — I4891 Unspecified atrial fibrillation: Secondary | ICD-10-CM | POA: Diagnosis present

## 2018-06-25 DIAGNOSIS — K219 Gastro-esophageal reflux disease without esophagitis: Secondary | ICD-10-CM | POA: Diagnosis present

## 2018-06-25 DIAGNOSIS — C182 Malignant neoplasm of ascending colon: Secondary | ICD-10-CM | POA: Diagnosis present

## 2018-06-25 DIAGNOSIS — F419 Anxiety disorder, unspecified: Secondary | ICD-10-CM | POA: Diagnosis present

## 2018-06-25 DIAGNOSIS — G4730 Sleep apnea, unspecified: Secondary | ICD-10-CM | POA: Diagnosis present

## 2018-06-25 HISTORY — PX: COLON RESECTION: SHX5231

## 2018-06-25 SURGERY — COLECTOMY, HAND-ASSISTED, LAPAROSCOPIC
Anesthesia: General | Site: Abdomen | Laterality: Right

## 2018-06-25 MED ORDER — SODIUM CHLORIDE 0.9 % IV SOLN
2.0000 g | Freq: Two times a day (BID) | INTRAVENOUS | Status: AC
  Administered 2018-06-25 – 2018-06-26 (×3): 2 g via INTRAVENOUS
  Filled 2018-06-25 (×3): qty 2

## 2018-06-25 MED ORDER — CHLORHEXIDINE GLUCONATE CLOTH 2 % EX PADS: 6.0000 | MEDICATED_PAD | Freq: Once | CUTANEOUS | Status: DC

## 2018-06-25 MED ORDER — ALVIMOPAN 12 MG PO CAPS
12.0000 mg | ORAL_CAPSULE | Freq: Two times a day (BID) | ORAL | Status: DC
  Administered 2018-06-26 (×2): 12 mg via ORAL
  Filled 2018-06-25 (×2): qty 1

## 2018-06-25 MED ORDER — FENTANYL CITRATE (PF) 250 MCG/5ML IJ SOLN
INTRAMUSCULAR | Status: AC
  Filled 2018-06-25: qty 5

## 2018-06-25 MED ORDER — FENTANYL CITRATE (PF) 100 MCG/2ML IJ SOLN
INTRAMUSCULAR | Status: DC | PRN
  Administered 2018-06-25 (×5): 50 ug via INTRAVENOUS
  Administered 2018-06-25: 25 ug via INTRAVENOUS
  Administered 2018-06-25: 50 ug via INTRAVENOUS
  Administered 2018-06-25: 25 ug via INTRAVENOUS

## 2018-06-25 MED ORDER — ONDANSETRON HCL 4 MG/2ML IJ SOLN
INTRAMUSCULAR | Status: AC
  Filled 2018-06-25: qty 2

## 2018-06-25 MED ORDER — LACTATED RINGERS IV SOLN
INTRAVENOUS | Status: DC
  Administered 2018-06-25: 10:00:00 via INTRAVENOUS
  Administered 2018-06-25: 1000 mL via INTRAVENOUS
  Administered 2018-06-25: 13:00:00 via INTRAVENOUS

## 2018-06-25 MED ORDER — KETOROLAC TROMETHAMINE 15 MG/ML IJ SOLN
15.0000 mg | Freq: Four times a day (QID) | INTRAMUSCULAR | Status: DC | PRN
  Filled 2018-06-25: qty 1

## 2018-06-25 MED ORDER — ACETAMINOPHEN 500 MG PO TABS
1000.0000 mg | ORAL_TABLET | ORAL | Status: AC
  Administered 2018-06-25: 1000 mg via ORAL

## 2018-06-25 MED ORDER — ONDANSETRON 4 MG PO TBDP: 4.0000 mg | ORAL_TABLET | Freq: Four times a day (QID) | ORAL | Status: DC | PRN

## 2018-06-25 MED ORDER — POVIDONE-IODINE 10 % EX OINT
TOPICAL_OINTMENT | CUTANEOUS | Status: AC
  Filled 2018-06-25: qty 1

## 2018-06-25 MED ORDER — BUPIVACAINE LIPOSOME 1.3 % IJ SUSP
INTRAMUSCULAR | Status: AC
  Filled 2018-06-25: qty 20

## 2018-06-25 MED ORDER — FENTANYL CITRATE (PF) 100 MCG/2ML IJ SOLN
INTRAMUSCULAR | Status: AC
  Filled 2018-06-25: qty 2

## 2018-06-25 MED ORDER — SUGAMMADEX SODIUM 200 MG/2ML IV SOLN
INTRAVENOUS | Status: DC | PRN
  Administered 2018-06-25: 190.6 mg via INTRAVENOUS

## 2018-06-25 MED ORDER — LIDOCAINE HCL (PF) 1 % IJ SOLN
INTRAMUSCULAR | Status: AC
  Filled 2018-06-25: qty 5

## 2018-06-25 MED ORDER — HYDROCODONE-ACETAMINOPHEN 7.5-325 MG PO TABS: 1.0000 | ORAL_TABLET | Freq: Once | ORAL | Status: DC | PRN

## 2018-06-25 MED ORDER — METOPROLOL TARTRATE 5 MG/5ML IV SOLN: 5.0000 mg | Freq: Four times a day (QID) | INTRAVENOUS | Status: DC | PRN

## 2018-06-25 MED ORDER — GABAPENTIN 300 MG PO CAPS
300.0000 mg | ORAL_CAPSULE | Freq: Two times a day (BID) | ORAL | Status: DC
  Administered 2018-06-25 – 2018-06-28 (×6): 300 mg via ORAL
  Filled 2018-06-25 (×6): qty 1

## 2018-06-25 MED ORDER — ONDANSETRON HCL 4 MG/2ML IJ SOLN: 4.0000 mg | Freq: Four times a day (QID) | INTRAMUSCULAR | Status: DC | PRN

## 2018-06-25 MED ORDER — SUGAMMADEX SODIUM 200 MG/2ML IV SOLN
INTRAVENOUS | Status: AC
  Filled 2018-06-25: qty 2

## 2018-06-25 MED ORDER — SIMETHICONE 80 MG PO CHEW: 40.0000 mg | CHEWABLE_TABLET | Freq: Four times a day (QID) | ORAL | Status: DC | PRN

## 2018-06-25 MED ORDER — DIPHENHYDRAMINE HCL 12.5 MG/5ML PO ELIX: 12.5000 mg | ORAL_SOLUTION | Freq: Four times a day (QID) | ORAL | Status: DC | PRN

## 2018-06-25 MED ORDER — FENTANYL CITRATE (PF) 100 MCG/2ML IJ SOLN
25.0000 ug | INTRAMUSCULAR | Status: DC | PRN
Start: 2018-06-25 — End: 2018-06-25
  Administered 2018-06-25 (×2): 50 ug via INTRAVENOUS
  Filled 2018-06-25 (×2): qty 2

## 2018-06-25 MED ORDER — DOCUSATE SODIUM 100 MG PO CAPS
100.0000 mg | ORAL_CAPSULE | Freq: Two times a day (BID) | ORAL | Status: DC
  Administered 2018-06-25 – 2018-06-28 (×5): 100 mg via ORAL
  Filled 2018-06-25 (×6): qty 1

## 2018-06-25 MED ORDER — LEVOTHYROXINE SODIUM 75 MCG PO TABS
75.0000 ug | ORAL_TABLET | Freq: Every day | ORAL | Status: DC
  Administered 2018-06-26 – 2018-06-28 (×3): 75 ug via ORAL
  Filled 2018-06-25 (×3): qty 1

## 2018-06-25 MED ORDER — METOPROLOL SUCCINATE ER 25 MG PO TB24
25.0000 mg | ORAL_TABLET | Freq: Every day | ORAL | Status: DC
  Administered 2018-06-26 – 2018-06-27 (×2): 25 mg via ORAL
  Filled 2018-06-25 (×2): qty 1

## 2018-06-25 MED ORDER — ROCURONIUM BROMIDE 100 MG/10ML IV SOLN
INTRAVENOUS | Status: DC | PRN
  Administered 2018-06-25: 10 mg via INTRAVENOUS
  Administered 2018-06-25: 50 mg via INTRAVENOUS
  Administered 2018-06-25: 20 mg via INTRAVENOUS

## 2018-06-25 MED ORDER — SODIUM CHLORIDE 0.9 % IV SOLN
2.0000 g | INTRAVENOUS | Status: AC
  Administered 2018-06-25: 2 g via INTRAVENOUS

## 2018-06-25 MED ORDER — LACTATED RINGERS IV SOLN
INTRAVENOUS | Status: DC
  Administered 2018-06-25 – 2018-06-26 (×3): via INTRAVENOUS

## 2018-06-25 MED ORDER — LOSARTAN POTASSIUM 25 MG PO TABS
12.5000 mg | ORAL_TABLET | Freq: Every day | ORAL | Status: DC
  Administered 2018-06-26 – 2018-06-27 (×2): 12.5 mg via ORAL
  Filled 2018-06-25 (×2): qty 1

## 2018-06-25 MED ORDER — ZOLPIDEM TARTRATE 5 MG PO TABS
5.0000 mg | ORAL_TABLET | Freq: Every day | ORAL | Status: DC
  Administered 2018-06-25 – 2018-06-27 (×3): 5 mg via ORAL
  Filled 2018-06-25 (×3): qty 1

## 2018-06-25 MED ORDER — KETOROLAC TROMETHAMINE 30 MG/ML IJ SOLN
INTRAMUSCULAR | Status: AC
  Filled 2018-06-25: qty 1

## 2018-06-25 MED ORDER — SODIUM CHLORIDE 0.9 % IR SOLN
Status: DC | PRN
  Administered 2018-06-25: 3000 mL

## 2018-06-25 MED ORDER — PROPOFOL 10 MG/ML IV BOLUS
INTRAVENOUS | Status: DC | PRN
  Administered 2018-06-25: 150 mg via INTRAVENOUS

## 2018-06-25 MED ORDER — SODIUM CHLORIDE 0.9 % IV SOLN
INTRAVENOUS | Status: AC
  Filled 2018-06-25: qty 2

## 2018-06-25 MED ORDER — GABAPENTIN 300 MG PO CAPS
ORAL_CAPSULE | ORAL | Status: AC
  Filled 2018-06-25: qty 1

## 2018-06-25 MED ORDER — ACETAMINOPHEN 500 MG PO TABS
1000.0000 mg | ORAL_TABLET | Freq: Four times a day (QID) | ORAL | Status: DC
  Administered 2018-06-25 – 2018-06-28 (×9): 1000 mg via ORAL
  Filled 2018-06-25 (×11): qty 2

## 2018-06-25 MED ORDER — ROCURONIUM BROMIDE 50 MG/5ML IV SOLN
INTRAVENOUS | Status: AC
  Filled 2018-06-25: qty 1

## 2018-06-25 MED ORDER — DIPHENHYDRAMINE HCL 50 MG/ML IJ SOLN: 12.5000 mg | Freq: Four times a day (QID) | INTRAMUSCULAR | Status: DC | PRN

## 2018-06-25 MED ORDER — PAROXETINE HCL 20 MG PO TABS
20.0000 mg | ORAL_TABLET | Freq: Every day | ORAL | Status: DC
  Administered 2018-06-25 – 2018-06-27 (×3): 20 mg via ORAL
  Filled 2018-06-25 (×3): qty 1

## 2018-06-25 MED ORDER — HEPARIN SODIUM (PORCINE) 5000 UNIT/ML IJ SOLN
5000.0000 [IU] | Freq: Once | INTRAMUSCULAR | Status: AC
  Administered 2018-06-25: 5000 [IU] via SUBCUTANEOUS
  Filled 2018-06-25: qty 1

## 2018-06-25 MED ORDER — 0.9 % SODIUM CHLORIDE (POUR BTL) OPTIME
TOPICAL | Status: DC | PRN
  Administered 2018-06-25 (×2): 1000 mL

## 2018-06-25 MED ORDER — GABAPENTIN 300 MG PO CAPS
300.0000 mg | ORAL_CAPSULE | ORAL | Status: AC
  Administered 2018-06-25: 300 mg via ORAL

## 2018-06-25 MED ORDER — SUCCINYLCHOLINE CHLORIDE 20 MG/ML IJ SOLN
INTRAMUSCULAR | Status: AC
  Filled 2018-06-25: qty 1

## 2018-06-25 MED ORDER — EPHEDRINE SULFATE 50 MG/ML IJ SOLN
INTRAMUSCULAR | Status: DC | PRN
  Administered 2018-06-25 (×4): 10 mg via INTRAVENOUS

## 2018-06-25 MED ORDER — ENOXAPARIN SODIUM 40 MG/0.4ML ~~LOC~~ SOLN
40.0000 mg | SUBCUTANEOUS | Status: DC
  Administered 2018-06-26: 40 mg via SUBCUTANEOUS
  Filled 2018-06-25: qty 0.4

## 2018-06-25 MED ORDER — BUPIVACAINE LIPOSOME 1.3 % IJ SUSP
INTRAMUSCULAR | Status: DC | PRN
  Administered 2018-06-25: 10 mL

## 2018-06-25 MED ORDER — ALVIMOPAN 12 MG PO CAPS
12.0000 mg | ORAL_CAPSULE | ORAL | Status: AC
  Administered 2018-06-25: 12 mg via ORAL

## 2018-06-25 MED ORDER — ONDANSETRON HCL 4 MG/2ML IJ SOLN
INTRAMUSCULAR | Status: DC | PRN
  Administered 2018-06-25: 4 mg via INTRAVENOUS

## 2018-06-25 MED ORDER — ALVIMOPAN 12 MG PO CAPS
ORAL_CAPSULE | ORAL | Status: AC
  Filled 2018-06-25: qty 1

## 2018-06-25 MED ORDER — PROPOFOL 10 MG/ML IV BOLUS
INTRAVENOUS | Status: AC
  Filled 2018-06-25: qty 20

## 2018-06-25 MED ORDER — OXYCODONE HCL 5 MG PO TABS
5.0000 mg | ORAL_TABLET | ORAL | Status: DC | PRN
  Administered 2018-06-25 – 2018-06-28 (×5): 5 mg via ORAL
  Filled 2018-06-25 (×5): qty 1

## 2018-06-25 MED ORDER — ADULT MULTIVITAMIN W/MINERALS CH
1.0000 | ORAL_TABLET | Freq: Every day | ORAL | Status: DC
  Administered 2018-06-25 – 2018-06-28 (×4): 1 via ORAL
  Filled 2018-06-25 (×4): qty 1

## 2018-06-25 MED ORDER — KETOROLAC TROMETHAMINE 30 MG/ML IJ SOLN
15.0000 mg | Freq: Once | INTRAMUSCULAR | Status: AC
  Administered 2018-06-25: 15 mg via INTRAVENOUS

## 2018-06-25 MED ORDER — KETOROLAC TROMETHAMINE 15 MG/ML IJ SOLN
15.0000 mg | Freq: Four times a day (QID) | INTRAMUSCULAR | Status: DC
  Filled 2018-06-25: qty 1

## 2018-06-25 MED ORDER — MORPHINE SULFATE (PF) 2 MG/ML IV SOLN: 2.0000 mg | INTRAVENOUS | Status: DC | PRN

## 2018-06-25 MED ORDER — TAMSULOSIN HCL 0.4 MG PO CAPS
0.4000 mg | ORAL_CAPSULE | Freq: Every day | ORAL | Status: DC
  Administered 2018-06-25 – 2018-06-28 (×4): 0.4 mg via ORAL
  Filled 2018-06-25 (×4): qty 1

## 2018-06-25 MED ORDER — PANTOPRAZOLE SODIUM 40 MG PO TBEC
40.0000 mg | DELAYED_RELEASE_TABLET | Freq: Every day | ORAL | Status: DC
  Administered 2018-06-25 – 2018-06-28 (×4): 40 mg via ORAL
  Filled 2018-06-25 (×4): qty 1

## 2018-06-25 MED ORDER — ACETAMINOPHEN 500 MG PO TABS
ORAL_TABLET | ORAL | Status: AC
  Filled 2018-06-25: qty 2

## 2018-06-25 MED ORDER — LIDOCAINE HCL (CARDIAC) PF 100 MG/5ML IV SOSY
PREFILLED_SYRINGE | INTRAVENOUS | Status: DC | PRN
  Administered 2018-06-25: 40 mg via INTRAVENOUS

## 2018-06-25 MED ORDER — DEXMEDETOMIDINE HCL IN NACL 200 MCG/50ML IV SOLN
INTRAVENOUS | Status: DC | PRN
  Administered 2018-06-25: 6 ug via INTRAVENOUS

## 2018-06-25 SURGICAL SUPPLY — 49 items
CELLS DAT CNTRL 66122 CELL SVR (MISCELLANEOUS) ×1 IMPLANT
COVER LIGHT HANDLE STERIS (MISCELLANEOUS) ×4 IMPLANT
DRSG OPSITE POSTOP 4X8 (GAUZE/BANDAGES/DRESSINGS) ×2 IMPLANT
DRSG TEGADERM 2-3/8X2-3/4 SM (GAUZE/BANDAGES/DRESSINGS) ×4 IMPLANT
ELECT REM PT RETURN 9FT ADLT (ELECTROSURGICAL) ×2
ELECTRODE REM PT RTRN 9FT ADLT (ELECTROSURGICAL) ×1 IMPLANT
FILTER SMOKE EVAC LAPAROSHD (FILTER) ×2 IMPLANT
GLOVE BIO SURGEON STRL SZ 6.5 (GLOVE) ×6 IMPLANT
GLOVE BIOGEL PI IND STRL 6.5 (GLOVE) ×2 IMPLANT
GLOVE BIOGEL PI IND STRL 7.0 (GLOVE) ×5 IMPLANT
GLOVE BIOGEL PI INDICATOR 6.5 (GLOVE) ×2
GLOVE BIOGEL PI INDICATOR 7.0 (GLOVE) ×5
GLOVE ECLIPSE 6.5 STRL STRAW (GLOVE) ×2 IMPLANT
GLOVE SURG SS PI 7.5 STRL IVOR (GLOVE) ×6 IMPLANT
GOWN STRL REUS W/ TWL XL LVL3 (GOWN DISPOSABLE) ×2 IMPLANT
GOWN STRL REUS W/TWL LRG LVL3 (GOWN DISPOSABLE) ×8 IMPLANT
GOWN STRL REUS W/TWL XL LVL3 (GOWN DISPOSABLE) ×2
INST SET LAPROSCOPIC AP (KITS) ×2 IMPLANT
INST SET MAJOR GENERAL (KITS) ×2 IMPLANT
IV NS IRRIG 3000ML ARTHROMATIC (IV SOLUTION) ×2 IMPLANT
KIT TURNOVER CYSTO (KITS) ×2 IMPLANT
LIGASURE LAP ATLAS 10MM 37CM (INSTRUMENTS) ×2 IMPLANT
MANIFOLD NEPTUNE II (INSTRUMENTS) ×2 IMPLANT
NEEDLE HYPO 21X1.5 SAFETY (NEEDLE) ×2 IMPLANT
NS IRRIG 1000ML POUR BTL (IV SOLUTION) ×2 IMPLANT
PACK COLON (CUSTOM PROCEDURE TRAY) ×2 IMPLANT
PAD ARMBOARD 7.5X6 YLW CONV (MISCELLANEOUS) ×2 IMPLANT
PENCIL HANDSWITCHING (ELECTRODE) ×2 IMPLANT
RELOAD PROXIMATE 75MM BLUE (ENDOMECHANICALS) ×4 IMPLANT
RTRCTR WOUND ALEXIS 18CM MED (MISCELLANEOUS) ×2
SET TUBE IRRIG SUCTION NO TIP (IRRIGATION / IRRIGATOR) ×2 IMPLANT
SLEEVE ENDOPATH XCEL 5M (ENDOMECHANICALS) ×2 IMPLANT
SPONGE GAUZE 2X2 8PLY STRL LF (GAUZE/BANDAGES/DRESSINGS) ×2 IMPLANT
SPONGE LAP 18X18 X RAY DECT (DISPOSABLE) ×4 IMPLANT
STAPLER GUN LINEAR PROX 60 (STAPLE) ×2 IMPLANT
STAPLER PROXIMATE 75MM BLUE (STAPLE) ×6 IMPLANT
STAPLER VISISTAT (STAPLE) ×2 IMPLANT
SUT PDS AB CT VIOLET #0 27IN (SUTURE) ×4 IMPLANT
SUT SILK 3 0 SH CR/8 (SUTURE) ×2 IMPLANT
SUT VIC AB 3-0 SH 27 (SUTURE) ×1
SUT VIC AB 3-0 SH 27X BRD (SUTURE) ×1 IMPLANT
SUT VICRYL 0 UR6 27IN ABS (SUTURE) ×2 IMPLANT
SYR 20CC LL (SYRINGE) ×2 IMPLANT
TRAY FOLEY METER SIL LF 16FR (CATHETERS) ×2 IMPLANT
TROCAR ENDO BLADELESS 11MM (ENDOMECHANICALS) ×2 IMPLANT
TROCAR XCEL NON-BLD 5MMX100MML (ENDOMECHANICALS) ×2 IMPLANT
TUBING INSUFFLATION (TUBING) ×2 IMPLANT
WARMER LAPAROSCOPE (MISCELLANEOUS) ×2 IMPLANT
YANKAUER SUCT BULB TIP 10FT TU (MISCELLANEOUS) ×4 IMPLANT

## 2018-06-25 NOTE — Anesthesia Preprocedure Evaluation (Signed)
Anesthesia Evaluation  Patient identified by MRN, date of birth, ID band Patient awake  General Assessment Comment:States h/o Emergence delirium x1 only  Thought related to fentanyl - per wife  Reviewed: Allergy & Precautions, NPO status , Patient's Chart, lab work & pertinent test results  History of Anesthesia Complications (+) history of anesthetic complications  Airway Mallampati: III  TM Distance: >3 FB Neck ROM: Full    Dental no notable dental hx.    Pulmonary neg pulmonary ROS, sleep apnea and Continuous Positive Airway Pressure Ventilation ,    Pulmonary exam normal breath sounds clear to auscultation       Cardiovascular Exercise Tolerance: Good hypertension, Pt. on medications negative cardio ROS Normal cardiovascular examI Rhythm:Regular Rate:Normal  H/o Afib after anesth - was placed on B Blocker and spont converted to SR  Denies Cardioversion   Neuro/Psych PSYCHIATRIC DISORDERS Anxiety Depression negative neurological ROS  negative psych ROS   GI/Hepatic negative GI ROS, Neg liver ROS, GERD  Medicated and Controlled,  Endo/Other  negative endocrine ROSHypothyroidism   Renal/GU negative Renal ROS  negative genitourinary   Musculoskeletal negative musculoskeletal ROS (+) Arthritis , Osteoarthritis,    Abdominal   Peds negative pediatric ROS (+)  Hematology negative hematology ROS (+)   Anesthesia Other Findings H/o Post cervical fixation  Reproductive/Obstetrics negative OB ROS                             Anesthesia Physical Anesthesia Plan  ASA: II  Anesthesia Plan: General   Post-op Pain Management:    Induction: Intravenous  PONV Risk Score and Plan:   Airway Management Planned:   Additional Equipment:   Intra-op Plan:   Post-operative Plan: Extubation in OR  Informed Consent: I have reviewed the patients History and Physical, chart, labs and discussed  the procedure including the risks, benefits and alternatives for the proposed anesthesia with the patient or authorized representative who has indicated his/her understanding and acceptance.   Dental advisory given  Plan Discussed with: CRNA  Anesthesia Plan Comments:         Anesthesia Quick Evaluation

## 2018-06-25 NOTE — Progress Notes (Signed)
Pt has home CPAP. CPAP plugged into red outlet with no sign of damage. Family at bedside

## 2018-06-25 NOTE — Op Note (Signed)
[facility] Operative Note  06/25/18  Preoperative Diagnosis: Hepatic flexure colon cancer    Postoperative Diagnosis: Same   Procedure(s) Performed: Laparoscopic right hemicolectomy with extracorporeal anastomosis    Surgeon: Lanell Matar. Constance Haw, MD   Assistants: No qualified resident was available    Anesthesia: General endotracheal   Anesthesiologist: Lenice Llamas, MD    Specimens: Right colon    Estimated Blood Loss: Minimal   Blood Replacement: None    Complications: None   Wound Class: Clean Contaminated    Operative Indications: Allen Hebert is a 77 yo with a newly diagnosed hepatic flexure colon cancer.  He had been having some anemia and underwent a colonoscopy that demonstrated a hepatic flexure mass that was biopsied and showed carcinoma.  I saw him in clinic and we discussed the risk and benefits of a laparoscopic right hemicolectomy including but not limited to bleeding, infection, risk of injury to the ureter or other organ, risk of leak, and he opted to proceed. He completed his bowel preparation prior to surgery and had been off his Eliquis for 3 days.   Findings: Redundant, floppy cecum and right colon, hepatic flexure mass, distal margin > 5cm from mass    Procedure: The patient was taken to the operating room and placed supine. General endotracheal anesthesia was induced. Intravenous antibiotics were administered per protocol.  An orogastric tube positioned to decompress the stomach. The abdomen was prepared and draped in the usual sterile fashion.   A supraumbilical  incision was made and a Veress technique was utilized to achieve pneumoperitoneum to 15 mmHg with carbon dioxide. A 11 mm optiview port was placed through the supraumbilical region, and a 10 mm 0-degree operative laparoscope was introduced.  The abdomen was inspected and there was no obvious masses, or studding of the peritoneal lining or omentum, the liver was without obvious  masses.    A suprapubic 5 mm trocar was placed under direct visualization, and an additional 65mm trocar was placed in the left mid abdomen under direct visualization.  The camera was changed to the 30 degree scope, and the patient was placed in reverse Trendelenburg position with the right side up.  The small bowel was cleared from the operative field. The cecum and right colon were very floppy and redundant.  The cecum was grasped and the ileocolic artery was tented upward. Dissection started at the base of the ileocolic artery where it joined the superior mesenteric artery.  The peritoneum overlying the ileocolic artery was opened with electrocautery, and this was taken to the base of the transverse mesocolon.  I was concerned that there was a very bulbous area under the mesentery, and to ensure that this was not the duodenum, I turned my attention to the lateral dissection.  The white line of Toldt was taken down inferiorly and lateral to the cecum with a combination of electrocautery and blunt dissection, and the appendix and cecum were freed. The retroperitoneum remained intact, and the retroperitoneal fat was abundant. The right ureter was identified and protected.  The dissection was carried up the avascular plane in the lateral side wall and toward the duodenum medially. The c loop of the duodenum was visualized, and was deeper than the area in the mesentery that I had been concerned about.    Given the location of the mass in the hepatic flexure, I looked for any obvious signs of a mass, but could not palpate anything with the graspers.  The omentum was very long  and adherent to the transverse colon.  The portion of the omentum adherent to the colon near the hepatic flexure was divided and left in place, and the avascular plan between the colon and the liver was dissected out until the hepatic attachments were taken down.  This connected the dissection laterally and the entire right colon could be  retracted medially with the duodenum being visualized inferiorly.    From here I returned to the ileocolic vessels and further dissected these out with full visualization of the retroperitoneum and duodenum inferiorly.  The ileocolic artery and vein were taken with the Ligasure device, and dissection continued on the right side of the superior mesenteric artery leading to the identification of the right colic artery and vein which were dissected out and divided.  The colon was free from all attachments laterally and in the hepatic flexure.  The colon reached the anterior abdomen without issues.    From here, the supraumbilical port site was extended to about 5cm, and a wound protector was placed. The pneumoperitoneum was released and the 2 additional ports were removed.  The colon was grasped and eviscerated out of the abdomen.  The small bowel was divided with a linear cutting stapler.  I felt the colon to ensure that the mass was included in the specimen.  Some minor hepatic flexure attachments were taken down, and the distal colon was eviscerated from the abdomen. The distal transection point was determined, and was divided with an additional linear cutting stapler.  The remaining mesentery was dissected with the Ligasure device, ensuring that the duodenum was protected. The specimen was removed. I opened up the specimen on the back table, and measured a margin of > 5 cm from the mass to the distal staple line. I replaced my outer gloves. The two ends of the ileum and transverse colon were aligned ensuring no twisting of the mesentery.  A stapled side to side anastomosis was made with a linear cutting stapler, and the common enterotomy was closed with a TIA stapler.  The lumen was greater than 2 fingerbreadth. The crotch was secured with a 3-0 silk suture and the staple line was oversewn in the Lembert fashion with 3-0 Silk suture.  The mesenteric defect was closed with a 3-0 vicryl suture. Omentum was placed  over the staple line.  The abdomen was irrigated with warm saline.   The entire operating team changed gowns and gloves. New instruments were used for closing.  The fascia was closed with 0 PDS suture in the running fashion, and the 11 mm port in the left mid abdomen was closed with a 0 vicryl suture.  The skin was closed with staples. Sterile dressings were placed.   Final inspection revealed acceptable hemostasis. All counts were correct at the end of the case. The patient was awakened from anesthesia and extubate without complication.  The patient went to the PACU in stable condition.   Curlene Labrum, MD [facility] [address] [place], [place] [address] [phone] (office)

## 2018-06-25 NOTE — Interval H&P Note (Signed)
History and Physical Interval Note:  06/25/2018 8:52 AM  Allen Hebert  has presented today for surgery, with the diagnosis of colon cancer  The various methods of treatment have been discussed with the patient and family. After consideration of risks, benefits and other options for treatment, the patient has consented to  Procedure(s): LAPAROSCOPIC RIGHT HEMI COLECTOMY (Right) as a surgical intervention .  The patient's history has been reviewed, patient examined, no change in status, stable for surgery.  I have reviewed the patient's chart and labs.  Questions were answered to the patient's satisfaction.    No additional questions. Bowel prep went well. Off eliquis. Will plan to restart as soon as possible post op.   Virl Cagey

## 2018-06-25 NOTE — Anesthesia Postprocedure Evaluation (Signed)
Anesthesia Post Note  Patient: [name]  Procedure(s) Performed: LAPAROSCOPIC RIGHT HEMI COLECTOMY (Right Abdomen)  Patient location during evaluation: PACU Anesthesia Type: General Level of consciousness: awake and alert and oriented Pain management: pain level controlled Vital Signs Assessment: post-procedure vital signs reviewed and stable Respiratory status: spontaneous breathing and patient connected to nasal cannula oxygen Cardiovascular status: stable Postop Assessment: no apparent nausea or vomiting Anesthetic complications: no     Last Vitals:  Vitals:   06/25/18 1330 06/25/18 1345  BP: 113/75 121/78  Pulse: 66 69  Resp: 12 12  Temp:    SpO2: 96% 99%    Last Pain:  Vitals:   06/25/18 1322  TempSrc:   PainSc: 7                  Allen Hebert

## 2018-06-25 NOTE — Anesthesia Procedure Notes (Signed)
Procedure Name: Intubation Date/Time: 06/25/2018 9:43 AM Performed by: Andree Elk, Stasha Naraine A, CRNA Pre-anesthesia Checklist: Patient identified, Timeout performed, Emergency Drugs available, Suction available and Patient being monitored Patient Re-evaluated:Patient Re-evaluated prior to induction Oxygen Delivery Method: Circle system utilized Preoxygenation: Pre-oxygenation with 100% oxygen Induction Type: IV induction and Cricoid Pressure applied Ventilation: Mask ventilation without difficulty Laryngoscope Size: 3 and Miller Grade View: Grade II Tube type: Oral Tube size: 7.0 mm Number of attempts: 1 Airway Equipment and Method: Stylet Placement Confirmation: ETT inserted through vocal cords under direct vision,  positive ETCO2 and breath sounds checked- equal and bilateral Secured at: 21 cm Tube secured with: Tape Dental Injury: Teeth and Oropharynx as per pre-operative assessment

## 2018-06-25 NOTE — Transfer of Care (Signed)
Immediate Anesthesia Transfer of Care Note  Patient: Allen Hebert  Procedure(s) Performed: LAPAROSCOPIC RIGHT HEMI COLECTOMY (Right Abdomen)  Patient Location: PACU  Anesthesia Type:General  Level of Consciousness: drowsy and patient cooperative  Airway & Oxygen Therapy: Patient Spontanous Breathing and Patient connected to face mask oxygen  Post-op Assessment: Report given to RN and Post -op Vital signs reviewed and stable  Post vital signs: Reviewed and stable  Last Vitals:  Vitals Value Taken Time  BP 140/81 06/25/2018  1:00 PM  Temp    Pulse 71 06/25/2018  1:05 PM  Resp 16 06/25/2018  1:05 PM  SpO2 100 % 06/25/2018  1:05 PM  Vitals shown include unvalidated device data.  Last Pain:  Vitals:   06/25/18 0811  TempSrc: Oral  PainSc: 0-No pain      Patients Stated Pain Goal: 5 (66/59/93 5701)  Complications: No apparent anesthesia complications

## 2018-06-26 ENCOUNTER — Encounter (HOSPITAL_COMMUNITY): Payer: Self-pay | Admitting: General Surgery

## 2018-06-26 LAB — BASIC METABOLIC PANEL
Anion gap: 6 (ref 5–15)
BUN: 18 mg/dL (ref 8–23)
CO2: 23 mmol/L (ref 22–32)
CREATININE: 1.44 mg/dL — AB (ref 0.61–1.24)
Calcium: 7.6 mg/dL — ABNORMAL LOW (ref 8.9–10.3)
Chloride: 109 mmol/L (ref 98–111)
GFR calc non Af Amer: 45 mL/min — ABNORMAL LOW (ref >60)
GFR, EST AFRICAN AMERICAN: 52 mL/min — AB (ref >60)
Glucose, Bld: 96 mg/dL (ref 70–99)
Potassium: 3.7 mmol/L (ref 3.5–5.1)
SODIUM: 138 mmol/L (ref 135–145)

## 2018-06-26 LAB — CBC WITH DIFFERENTIAL/PLATELET
Basophils Absolute: 0 10*3/uL (ref 0.0–0.1)
Basophils Relative: 0 %
EOS ABS: 0.2 10*3/uL (ref 0.0–0.7)
Eosinophils Relative: 3 %
HCT: 33 % — ABNORMAL LOW (ref 39.0–52.0)
HEMOGLOBIN: 10.7 g/dL — AB (ref 13.0–17.0)
Lymphocytes Relative: 10 %
Lymphs Abs: 0.8 10*3/uL (ref 0.7–4.0)
MCH: 30.7 pg (ref 26.0–34.0)
MCHC: 32.4 g/dL (ref 30.0–36.0)
MCV: 94.8 fL (ref 78.0–100.0)
MONOS PCT: 8 %
Monocytes Absolute: 0.6 10*3/uL (ref 0.1–1.0)
NEUTROS PCT: 79 %
Neutro Abs: 5.7 10*3/uL (ref 1.7–7.7)
Platelets: 151 10*3/uL (ref 150–400)
RBC: 3.48 MIL/uL — ABNORMAL LOW (ref 4.22–5.81)
RDW: 14.9 % (ref 11.5–15.5)
WBC: 7.3 10*3/uL (ref 4.0–10.5)

## 2018-06-26 LAB — PHOSPHORUS: Phosphorus: 3.5 mg/dL (ref 2.5–4.6)

## 2018-06-26 LAB — MAGNESIUM: MAGNESIUM: 1.9 mg/dL (ref 1.7–2.4)

## 2018-06-26 MED ORDER — POTASSIUM CHLORIDE CRYS ER 20 MEQ PO TBCR
20.0000 meq | EXTENDED_RELEASE_TABLET | Freq: Two times a day (BID) | ORAL | Status: DC
  Administered 2018-06-26 (×2): 20 meq via ORAL
  Filled 2018-06-26 (×2): qty 1

## 2018-06-26 NOTE — Progress Notes (Signed)
[facility] Progress Note  1 Day Post-Op  Subjective: No major issues. Having some beltching, otherwise pain ok. No nausea or vomiting.   Objective: Vital signs in last 24 hours: Temp:  [97.5 F (36.4 C)-98.7 F (37.1 C)] 97.5 F (36.4 C) (07/16 0619) Pulse Rate:  [55-75] 55 (07/16 0948) Resp:  [12-19] 19 (07/16 0619) BP: (93-141)/(62-86) 101/65 (07/16 0948) SpO2:  [93 %-100 %] 100 % (07/16 0619) Weight:  [210 lb (95.3 kg)] 210 lb (95.3 kg) (07/15 1843) Last BM Date: 06/24/18  Intake/Output from previous day: 07/15 0701 - 07/16 0700 In: 4617.9 [P.O.:840; I.V.:3594.6; IV Piggyback:183.3] Out: 825 [Urine:675; Blood:150] Intake/Output this shift: Total I/O In: 240 [P.O.:240] Out: -   General appearance: alert, cooperative and no distress Resp: normal work breathing GI: soft, mildly distended, tender along incision, dressings c/d/i  Extremities: extremities normal, atraumatic, no cyanosis or edema  Lab Results:  Recent Labs    06/26/18 0542  WBC 7.3  HGB 10.7*  HCT 33.0*  PLT 151   BMET Recent Labs    06/26/18 0542  NA 138  K 3.7  CL 109  CO2 23  GLUCOSE 96  BUN 18  CREATININE 1.44*  CALCIUM 7.6*    Anti-infectives: Anti-infectives (From admission, onward)   Start     Dose/Rate Route Frequency Ordered Stop   06/25/18 2000  cefoTEtan (CEFOTAN) 2 g in sodium chloride 0.9 % 100 mL IVPB     2 g 200 mL/hr over 30 Minutes Intravenous Every 12 hours 06/25/18 1456 06/27/18 0959   06/25/18 0745  cefoTEtan (CEFOTAN) 2 g in sodium chloride 0.9 % 100 mL IVPB     2 g 200 mL/hr over 30 Minutes Intravenous On call to O.R. 06/25/18 0736 06/25/18 2219      Assessment/Plan: Mr. Quintero is a 77 yo s/p Lap right hemicolectomy doing well.  -PRN for pain, Scheduled tylenol and gabapentin and toradol for ERAS -IS, OOB -Ambulate -SR, no HR elevations, off monitor today  -BP a little soft metoprolol ordered, holding losartan -Clears today until BM -K  replaced, UOP good, d/c foley -Perioperative antibiotics today, H&H drifted down, will await stabilization prior to Eliquis starting back  -SCDs, lovenox    LOS: 1 day    Virl Cagey 06/26/2018

## 2018-06-26 NOTE — Progress Notes (Signed)
Pt has home CPAP. CPAP plugged into red outlet. Family at bedside

## 2018-06-26 NOTE — Care Management Note (Signed)
Case Management Note  Patient Details  Name: Allen Hebert MRN: [id number] Date of Birth: 1941-06-05  Subjective/Objective:    Right hemicolectomy. Up in chair today, looks quite well.  From home with wife. Independent with ADL's. Does not use any assistive devices. No home health currently. Lives in [place], [place]. Has PCP.                Action/Plan: Anticipate DC home. CM will follow.   Expected Discharge Date:    06/28/2018              Expected Discharge Plan:   Home  In-House Referral:     Discharge planning Services   Case managment  Post Acute Care Choice:   NA Choice offered to:   NA  DME Arranged:    DME Agency:     HH Arranged:    HH Agency:     Status of Service:   in process  If discussed at [employer] of Stay Meetings, dates discussed:    Additional Comments:  Ariq Khamis, Chauncey Reading, RN 06/26/2018, 10:45 AM

## 2018-06-27 LAB — CBC WITH DIFFERENTIAL/PLATELET
BASOS ABS: 0 10*3/uL (ref 0.0–0.1)
BASOS PCT: 0 %
EOS ABS: 0.3 10*3/uL (ref 0.0–0.7)
EOS PCT: 5 %
HCT: 34.5 % — ABNORMAL LOW (ref 39.0–52.0)
Hemoglobin: 11.2 g/dL — ABNORMAL LOW (ref 13.0–17.0)
Lymphocytes Relative: 11 %
Lymphs Abs: 0.6 10*3/uL (ref 0.7–4.0)
MCH: 30.7 pg (ref 26.0–34.0)
MCHC: 32.5 g/dL (ref 30.0–36.0)
MCV: 94.5 fL (ref 78.0–100.0)
MONO ABS: 0.4 10*3/uL (ref 0.1–1.0)
Monocytes Relative: 8 %
Neutro Abs: 4.3 10*3/uL (ref 1.7–7.7)
Neutrophils Relative %: 76 %
PLATELETS: 142 10*3/uL — AB (ref 150–400)
RBC: 3.65 MIL/uL — ABNORMAL LOW (ref 4.22–5.81)
RDW: 14.8 % (ref 11.5–15.5)
WBC: 5.7 10*3/uL (ref 4.0–10.5)

## 2018-06-27 LAB — BASIC METABOLIC PANEL
ANION GAP: 6 (ref 5–15)
BUN: 12 mg/dL (ref 8–23)
CALCIUM: 8 mg/dL — AB (ref 8.9–10.3)
CO2: 24 mmol/L (ref 22–32)
CREATININE: 1.21 mg/dL (ref 0.61–1.24)
Chloride: 110 mmol/L (ref 98–111)
GFR calc Af Amer: >60 mL/min (ref >60)
GFR, EST NON AFRICAN AMERICAN: 56 mL/min — AB (ref >60)
GLUCOSE: 106 mg/dL — AB (ref 70–99)
Potassium: 4.3 mmol/L (ref 3.5–5.1)
Sodium: 140 mmol/L (ref 135–145)

## 2018-06-27 LAB — PHOSPHORUS: PHOSPHORUS: 2.3 mg/dL — AB (ref 2.5–4.6)

## 2018-06-27 LAB — MAGNESIUM: MAGNESIUM: 2 mg/dL (ref 1.7–2.4)

## 2018-06-27 LAB — GLUCOSE, CAPILLARY: Glucose-Capillary: 111 mg/dL — ABNORMAL HIGH (ref 70–99)

## 2018-06-27 MED ORDER — POTASSIUM & SODIUM PHOSPHATES 280-160-250 MG PO PACK
1.0000 | PACK | Freq: Three times a day (TID) | ORAL | Status: AC
  Administered 2018-06-27 (×2): 1 via ORAL
  Filled 2018-06-27 (×2): qty 1

## 2018-06-27 MED ORDER — APIXABAN 5 MG PO TABS
5.0000 mg | ORAL_TABLET | Freq: Two times a day (BID) | ORAL | Status: DC
  Administered 2018-06-27 – 2018-06-28 (×3): 5 mg via ORAL
  Filled 2018-06-27 (×3): qty 1

## 2018-06-27 NOTE — Progress Notes (Signed)
[facility] Progress Note  2 Days Post-Op  Subjective: No major issues. Having Bms. Some bloating at times. Loose stools. Tolerating diet.   Objective: Vital signs in last 24 hours: Temp:  [97.6 F (36.4 C)-97.8 F (36.6 C)] 97.8 F (36.6 C) (07/17 0655) Pulse Rate:  [55-63] 63 (07/17 0655) Resp:  [18-20] 18 (07/17 0655) BP: (101-121)/(65-79) 105/79 (07/17 0655) SpO2:  [98 %-100 %] 98 % (07/17 0655) Last BM Date: 06/27/18  Intake/Output from previous day: 07/16 0701 - 07/17 0700 In: 1691.3 [P.O.:840; I.V.:651.3; IV Piggyback:200] Out: 2300 [Urine:2300] Intake/Output this shift: No intake/output data recorded.  General appearance: alert, cooperative and no distress Resp: normal work breathing GI: soft, abdominal binder in place, appropriately tender, dressings c/d/i  Extremities: extremities normal, atraumatic, no cyanosis or edema  Lab Results:  Recent Labs    06/26/18 0542 06/27/18 0544  WBC 7.3 5.7  HGB 10.7* 11.2*  HCT 33.0* 34.5*  PLT 151 142*   BMET Recent Labs    06/26/18 0542 06/27/18 0544  NA 138 140  K 3.7 4.3  CL 109 110  CO2 23 24  GLUCOSE 96 106*  BUN 18 12  CREATININE 1.44* 1.21  CALCIUM 7.6* 8.0*   P Anti-infectives: Anti-infectives (From admission, onward)   Start     Dose/Rate Route Frequency Ordered Stop   06/25/18 2000  cefoTEtan (CEFOTAN) 2 g in sodium chloride 0.9 % 100 mL IVPB     2 g 200 mL/hr over 30 Minutes Intravenous Every 12 hours 06/25/18 1456 06/26/18 2156   06/25/18 0745  cefoTEtan (CEFOTAN) 2 g in sodium chloride 0.9 % 100 mL IVPB     2 g 200 mL/hr over 30 Minutes Intravenous On call to O.R. 06/25/18 0736 06/25/18 2219      Assessment/Plan: Mr. Lohmeyer is a 77 yo s/p Lap right hemicolectomy for a cancer. Doing well.  -PRN for pain + gabapentin, toradol -IS, OOB, Ambulate -Soft diet, entereg stopped  -HR good and on metoprolol, BP running soft so not starting losartan yet -UOP good, phos replaced,  LR off -SCDs, H&H stable, Eliquis started, lovenox stopped    LOS: 2 days    Virl Cagey 06/27/2018

## 2018-06-27 NOTE — Care Management Important Message (Signed)
Important Message  Patient Details  Name: THEOPHILE HARVIE MRN: [id number] Date of Birth: 12/30/1940   Medicare Important Message Given:  Yes    Shelda Altes 06/27/2018, 11:30 AM

## 2018-06-28 LAB — BASIC METABOLIC PANEL
Anion gap: 4 — ABNORMAL LOW (ref 5–15)
BUN: 13 mg/dL (ref 8–23)
CHLORIDE: 110 mmol/L (ref 98–111)
CO2: 25 mmol/L (ref 22–32)
Calcium: 8.1 mg/dL — ABNORMAL LOW (ref 8.9–10.3)
Creatinine, Ser: 1.19 mg/dL (ref 0.61–1.24)
GFR calc Af Amer: >60 mL/min (ref >60)
GFR calc non Af Amer: 57 mL/min — ABNORMAL LOW (ref >60)
Glucose, Bld: 120 mg/dL — ABNORMAL HIGH (ref 70–99)
POTASSIUM: 4.5 mmol/L (ref 3.5–5.1)
SODIUM: 139 mmol/L (ref 135–145)

## 2018-06-28 LAB — CBC WITH DIFFERENTIAL/PLATELET
Basophils Absolute: 0 10*3/uL (ref 0.0–0.1)
Basophils Relative: 0 %
EOS PCT: 4 %
Eosinophils Absolute: 0.3 10*3/uL (ref 0.0–0.7)
HCT: 33.9 % — ABNORMAL LOW (ref 39.0–52.0)
Hemoglobin: 11.1 g/dL — ABNORMAL LOW (ref 13.0–17.0)
LYMPHS ABS: 0.9 10*3/uL (ref 0.7–4.0)
LYMPHS PCT: 12 %
MCH: 30.6 pg (ref 26.0–34.0)
MCHC: 32.7 g/dL (ref 30.0–36.0)
MCV: 93.4 fL (ref 78.0–100.0)
Monocytes Absolute: 0.6 10*3/uL (ref 0.1–1.0)
Monocytes Relative: 8 %
Neutro Abs: 5.5 10*3/uL (ref 1.7–7.7)
Neutrophils Relative %: 76 %
PLATELETS: 187 10*3/uL (ref 150–400)
RBC: 3.63 MIL/uL — AB (ref 4.22–5.81)
RDW: 14.9 % (ref 11.5–15.5)
WBC: 7.3 10*3/uL (ref 4.0–10.5)

## 2018-06-28 LAB — PHOSPHORUS: Phosphorus: 2.2 mg/dL — ABNORMAL LOW (ref 2.5–4.6)

## 2018-06-28 LAB — MAGNESIUM: Magnesium: 2 mg/dL (ref 1.7–2.4)

## 2018-06-28 MED ORDER — DOCUSATE SODIUM 100 MG PO CAPS: 100.0000 mg | ORAL_CAPSULE | Freq: Two times a day (BID) | ORAL | 0 refills | Status: DC | PRN

## 2018-06-28 MED ORDER — OXYCODONE HCL 5 MG PO TABS: 5.0000 mg | ORAL_TABLET | ORAL | 0 refills | Status: DC | PRN

## 2018-06-28 MED ORDER — ONDANSETRON 4 MG PO TBDP: 4.0000 mg | ORAL_TABLET | Freq: Four times a day (QID) | ORAL | 0 refills | Status: AC | PRN

## 2018-06-28 NOTE — Progress Notes (Signed)
Pt has home CPAP. CPAP plugged into red outlet with no sign of damage

## 2018-06-28 NOTE — Discharge Instructions (Signed)
Discharge Instructions: Shower per your regular routine. Take tylenol and ibuprofen as needed for pain control, alternating every 4-6 hours.  Take Roxicodone for breakthrough pain. Take metamucil to bulk up your stools for now and if needed colace for constipation related to narcotic pain medication. Do not pick at the staples at your incision sites.  Dry dressing as needed over the staples.  Abdominal binder with activity/ ambulation. Take a break 1/2 way during your ride home today to ensure you are walking and moving around. You did not take your losartan while in the hospital. You BP has been running in the 120s / 70s and you have not needed this medication.  I would hold this medication for now until you are eating more, and check your BP at home or at a pharmacy.  Once your BP is higher > 130-140s then you can restart this medication.   Follow up with your PCP to let them know that you are not on the losartan for now.  Ambulate and increase activity slowly.  No lifting > 10 lbs, excessive bending, squatting, pushing/ pulling. No driving until you are able to slam on the breaks to avoid a car or person and you are not taking the narcotic pain medication. Stay hydrated and take in ensure for protein calories if you are not eating a normal amount of food.    Laparoscopic Colectomy, Care After This sheet gives you information about how to care for yourself after your procedure. Your health care provider may also give you more specific instructions. If you have problems or questions, contact your health care provider. What can I expect after the procedure? After your procedure, it is common to have the following:  Pain in your abdomen, especially in the incision areas. You will be given medicine to control the pain.  Tiredness. This is a normal part of the recovery process. Your energy level will return to normal over the next several weeks.  Changes in your bowel movements, such as  constipation or needing to go more often. Talk with your health care provider about how to manage this.  Follow these instructions at home: Medicines  Take over-the-counter and prescription medicines only as told by your health care provider.  Do not drive or use heavy machinery while taking prescription pain medicine.  Do not drink alcohol while taking prescription pain medicine.  If you were prescribed an antibiotic medicine, use it as told by your health care provider. Do not stop using the antibiotic even if you start to feel better. Incision care  Follow instructions from your health care provider about how to take care of your incision areas. Make sure you: ? Keep your incisions clean and dry. ? Wash your hands with soap and water before and after applying medicine to the areas, and before and after changing your bandage (dressing). If soap and water are not available, use hand sanitizer. ? Change your dressing as told by your health care provider. ? Leave stitches (sutures), skin glue, or adhesive strips in place. These skin closures may need to stay in place for 2 weeks or longer. If adhesive strip edges start to loosen and curl up, you may trim the loose edges. Do not remove adhesive strips completely unless your health care provider tells you to do that.  Do not wear tight clothing over the incisions. Tight clothing may rub and irritate the incision areas, which may cause the incisions to open.  Do not take baths, swim, or  use a hot tub until your health care provider approves. Ask your health care provider if you can take showers. You may only be allowed to take sponge baths for bathing.  Check your incision area every day for signs of infection. Check for: ? More redness, swelling, or pain. ? More fluid or blood. ? Warmth. ? Pus or a bad smell.                  Activity  Avoid lifting anything that is heavier than 10 lb (4.5 kg) for 2 weeks or until your  health care provider says it is okay.  You may resume normal activities as told by your health care provider. Ask your health care provider what activities are safe for you.  Take rest breaks during the day as needed. Eating and drinking  Follow instructions from your health care provider about what you can eat after surgery.  To prevent or treat constipation while you are taking prescription pain medicine, your health care provider may recommend that you: ? Drink enough fluid to keep your urine clear or pale yellow. ? Take over-the-counter or prescription medicines. ? Eat foods that are high in fiber, such as fresh fruits and vegetables, whole grains, and beans. ? Limit foods that are high in fat and processed sugars, such as fried and sweet foods. General instructions  Ask your health care provider when you will need an appointment to get your sutures or staples removed.  Keep all follow-up visits as told by your health care provider. This is important. Contact a health care provider if:  You have more redness, swelling, or pain around your incisions.  You have more fluid or blood coming from the incisions.  Your incisions feel warm to the touch.  You have pus or a bad smell coming from your incisions or your dressing.  You have a fever.  You have an incision that breaks open (edges not staying together) after sutures or staples have been removed. Get help right away if:  You develop a rash.  You have chest pain or difficulty breathing.  You have pain or swelling in your legs.  You feel light-headed or you faint.  Your abdomen swells (becomes distended).  You have nausea or vomiting.  You have blood in your stool (feces). This information is not intended to replace advice given to you by your health care provider. Make sure you discuss any questions you have with your health care provider. Document Released: 06/17/2005 Document Revised: 08/29/2016 Document Reviewed:  08/29/2016 Elsevier Interactive Patient Education  [employer].

## 2018-06-28 NOTE — Progress Notes (Signed)
Discharge instructions gone over with patient and spouse, verbalized understanding. IV removed, patient tolerated procedure well. PO PRN pain medicine given before DC per Dr. Constance Haw.

## 2018-06-28 NOTE — Discharge Summary (Signed)
Physician Discharge Summary  Patient ID: Allen Hebert MRN: [id number] DOB/AGE: 05-07-1941 77 y.o.  Admit date: 06/25/2018 Discharge date: 06/28/2018  Admission Diagnoses:  Discharge Diagnoses:  Active Problems:   Primary cancer of hepatic flexure of colon [facility])   Discharged Condition: good  Hospital Course: Allen Hebert is a 77 yo with a history of newly diagnosed hepatic flexure cancer who had undergone a colonoscopy due to anemia of unknown origin. We took him back for a laparoscopic right hemicolectomy on 7/15, and he did well post operatively.  He had adequate pain control, was able to advance from liquid diet to regular diet, and was ambulating prior to his discharge. He was having some loose stools prior to discharge, and overall felt well.  His eliquis was restarted and his H&H remained stable. He had no signs of bleeding.   Pathology returned with T3N0, 1 tumor implant, and distal margin reported at 4cm. On removal of the specimen. I did cut open the colon distally and measured the distal margin at that time to be > 5cm from the gross mass.  I am unsure if my opening of the colon made this margin appear shorter or not.  His microsatellite instability and mismatch repair are still pending.   He has asked to be referred to Oncology in [place]. We will send all of the records to his Oncologist, so they can make the best recommendation regarding need/ plans for any chemotherapy and surveillance in the future.   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 - CARCINOMA INVADES INTO PERICOLONIC SOFT TISSUE. - SURGICAL RESECTION MARGINS ARE NEGATIVE FOR CARCINOMA. - NEGATIVE FOR LYMPHOVASCULAR OR PERINEURAL INVASION. - ONE TUMOR DEPOSIT. - TWENTY-ONE LYMPH NODES, NEGATIVE FOR CARCINOMA (0/21). - SEPARATE SESSILE SERRATED POLYP(S) WITHOUT  CYTOLOGIC DYSPLASIA. - UNREMARKABLE APPENDIX. - SEE ONCOLOGY TABLE. - SEE NOTE. Microscopic Comment COLON AND RECTUM: Resection, Including Transanal Disk Excision of Rectal Neoplasms Procedure: Segmental resection of colon Tumor Site: Distal ascending colon and hepatic flexure Tumor Size: 4.6 cm Macroscopic Tumor Perforation: Not identified Histologic Type: Mucinous adenocarcinoma Histologic Grade: Low grade (moderately differentiated) Tumor Extension: Tumor invades into pericolonic soft tissue Margins: All margins are negative for carcinoma Treatment Effect: Not applicable Lymphovascular Invasion: Not identified Perineural Invasion: Not identified Tumor Deposits: Present, one tumor deposit Regional Lymph Nodes: Number of Lymph Nodes Involved: 0 Number of Lymph Nodes Examined: 21 Pathologic Stage Classification (pTNM, AJCC 8th Edition): pT3, pN0 Ancillary Studies: (MMR / MSI testing: ) Pending Representative tumor block: 1C Immunohistochemical stains for mismatch repair related proteins and molecular study for microsatellite instability (MSI) are pending and will be reported in an addendum.  Gross Specimen: Right colon including portion of terminal ileum and appendix. Specimen integrity: The distal end is disrupted at the stapled margin and partially opened. Specimen length: There are 6 cm of terminal ileum and right colon is 30 cm from cecum to distal margin. Mesorectal intactness: Not applicable. Tumor location: Distal ascending colon. Tumor size: There is a 3 cm in length and 4.6 cm in width tan-red, firm fungating mass which is up to 1.8 cm thick. Percent of bowel circumference involved: 60% Tumor distance to margins: Proximal: 24 cm Distal: 4 cm Radial (posterior ascending, posterior descending; lateral and posterior mid-rectum; and entire lower 1/3 rectum): The tumor is 1.4 cm from the non-peritonealized pericolonic soft tissue margin. Macroscopic extent of tumor  invasion: Tumor invades through  the muscularis and into fat. Total presumed lymph nodes: Found are twenty-four possible lymph nodes ranging from 0.3 to 0.8 cm. Extramural satellite tumor nodules: None. Mucosal polyp(s): Scattered throughout the right colon are a few tan soft polyps which are up to 0.4 cm. Additional findings: The appendix is 5.2 cm in length, averages 0.6 cm in diameter, has pink to hyperemic serosa with scattered adhesions, and unremarkable cut surfaces.  Consults: None  Significant Diagnostic Studies: None   Treatments: Laparoscopic Right Hemicolectomy on 715/19   Discharge Exam: Blood pressure 122/70, pulse 66, temperature 98.3 F (36.8 C), temperature source Oral, resp. rate 18, height '5\' 10"'$  (1.778 m), weight 210 lb (95.3 kg), SpO2 98 %. General appearance: alert, cooperative and no distress Resp: normal work breathing GI: soft, appropriately tender, mildly distended, staples c/d/i without erythema or drainage Extremities: extremities normal, atraumatic, no cyanosis or edema  Disposition: Discharge disposition: 01-Home or Self Care       Discharge Instructions    Call MD for:  difficulty breathing, headache or visual disturbances   Complete by:  As directed    Call MD for:  extreme fatigue   Complete by:  As directed    Call MD for:  persistant dizziness or light-headedness   Complete by:  As directed    Call MD for:  persistant nausea and vomiting   Complete by:  As directed    Call MD for:  redness, tenderness, or signs of infection (pain, swelling, redness, odor or green/yellow discharge around incision site)   Complete by:  As directed    Call MD for:  severe uncontrolled pain   Complete by:  As directed    Call MD for:  temperature >100.4   Complete by:  As directed    Diet - low sodium heart healthy   Complete by:  As directed    Increase activity slowly   Complete by:  As directed      Allergies as of 06/28/2018      Reactions   Lipitor  [atorvastatin] Other (See Comments)   Extreme weakness/fatigue      Medication List    STOP taking these medications   amoxicillin 500 MG capsule Commonly known as:  AMOXIL   metroNIDAZOLE 500 MG tablet Commonly known as:  FLAGYL   neomycin 500 MG tablet Commonly known as:  MYCIFRADIN     TAKE these medications   docusate sodium 100 MG capsule Commonly known as:  COLACE Take 1 capsule (100 mg total) by mouth 2 (two) times daily as needed for mild constipation.   ELIQUIS 5 MG Tabs tablet Generic drug:  apixaban Take 1 tablet (5 mg total) by mouth 2 (two) times daily.   ferrous sulfate 325 (65 FE) MG tablet Take 325 mg by mouth 2 (two) times daily.   levothyroxine 75 MCG tablet Commonly known as:  SYNTHROID, LEVOTHROID Take 75 mcg by mouth daily before breakfast.   losartan 25 MG tablet Commonly known as:  COZAAR Take 12.5-25 mg by mouth 2 (two) times daily. Take 1 tablet (25 mg) by mouth in the morning, & 0.5 tablet (12.5 mg) by mouth in the evening.   lovastatin 20 MG tablet Commonly known as:  MEVACOR Take 20 mg by mouth at bedtime.   metoprolol succinate 25 MG 24 hr tablet Commonly known as:  TOPROL-XL Take 25 mg by mouth daily.   multivitamin with minerals Tabs tablet Take 1 tablet by mouth daily.   ondansetron 4 MG disintegrating tablet  Commonly known as:  ZOFRAN-ODT Take 1 tablet (4 mg total) by mouth every 6 (six) hours as needed for nausea.   oxyCODONE 5 MG immediate release tablet Commonly known as:  Oxy IR/ROXICODONE Take 1 tablet (5 mg total) by mouth every 4 (four) hours as needed for severe pain or breakthrough pain.   PARoxetine 20 MG tablet Commonly known as:  PAXIL Take 20 mg by mouth at bedtime.   PROTONIX 40 MG tablet Generic drug:  pantoprazole Take 40 mg by mouth daily.   tamsulosin 0.4 MG Caps capsule Commonly known as:  FLOMAX Take 1 capsule by mouth daily.   zolpidem 5 MG tablet Commonly known as:  AMBIEN Take 5 mg by mouth  at bedtime.      Follow-up Information    Virl Cagey, MD Follow up on 07/05/2018.   Specialty:  General Surgery Why:  staple removal Contact information: [address] [place] [facility] [address] [phone]           Signed: Virl Cagey 06/28/2018, 9:32 AM

## 2018-07-01 ENCOUNTER — Telehealth: Payer: Self-pay | Admitting: General Surgery

## 2018-07-01 MED ORDER — METOCLOPRAMIDE HCL 5 MG PO TABS: 5.0000 mg | ORAL_TABLET | Freq: Four times a day (QID) | ORAL | 0 refills | Status: AC | PRN

## 2018-07-01 MED ORDER — CHLORPROMAZINE HCL 10 MG PO TABS: 10.0000 mg | ORAL_TABLET | Freq: Three times a day (TID) | ORAL | 0 refills | Status: AC | PRN

## 2018-07-01 NOTE — Telephone Encounter (Signed)
[facility]  I am not on call. But I received a message from [place] from Mr. Anastacio's wife.  Called back. Patient tolerating diet and having multiple BMs and even Bms today. Overnight though, had hiccups that caused him to vomit 4-5 times dark bilious output.   Overall not feeling nauseated when eating and says minimal bloating. Ordered thorazine for hiccups and reglan for any nausea /vomiting as needed.  Warned that if has worsening pain, nausea/vomiting, or stops having Bms to go to ED.    Patient otherwise well. Temp max 99.6, recommended IS and continue to monitor.   Updated Dr. Rosana Hoes who is on call, pending patient needing to go to ED.  Curlene Labrum, MD [facility] [address] [place], [place] [address] [phone] (office)

## 2018-07-02 ENCOUNTER — Telehealth: Payer: Self-pay | Admitting: General Surgery

## 2018-07-02 LAB — BPAM RBC
BLOOD PRODUCT EXPIRATION DATE: 201908172359
Blood Product Expiration Date: 201908172359
Unit Type and Rh: 5100
Unit Type and Rh: 5100

## 2018-07-02 LAB — TYPE AND SCREEN
ABO/RH(D): O POS
Antibody Screen: NEGATIVE
Unit division: 0
Unit division: 0

## 2018-07-02 NOTE — Telephone Encounter (Signed)
[facility]  Calling to check on patient.  Mr. Kauer, reports that he is having trouble eating, feeling that his throat is rough.  Having BMs daily. Does not see any thrush on his tongue. Has not vomited any more. Some hiccups.   The medications called in yesterday helped.   Given that he is having some trouble will see at 11Am tomorrow in the office.   Curlene Labrum, MD [facility] [address] [place], [place] [address] [phone] (office)

## 2018-07-05 ENCOUNTER — Encounter: Payer: Self-pay | Admitting: General Surgery

## 2018-07-05 ENCOUNTER — Ambulatory Visit (INDEPENDENT_AMBULATORY_CARE_PROVIDER_SITE_OTHER): Payer: Self-pay | Admitting: General Surgery

## 2018-07-05 VITALS — BP 119/72 | HR 71 | Temp 96.4°F | Resp 18 | Wt 198.0 lb

## 2018-07-05 DIAGNOSIS — C183 Malignant neoplasm of hepatic flexure: Secondary | ICD-10-CM

## 2018-07-05 NOTE — Progress Notes (Signed)
[facility] Note   HPI:  77 y.o. Male presents to clinic for post-op follow-up evaluation of after his laparoscopic right hemicolectomy. Patient reports he has stopped having nausea/ hiccup and the vomiting. He did not take much of the reglan or thorazine. He continued to have Bms. Overall he looks like he feels ok and is smiling.   Review of Systems:  No fevers or chills Regular BMs Nausea/vomiting resolved  All other review of systems: otherwise negative   Vital Signs:  BP 119/72 (BP Location: Left Arm, Patient Position: Sitting, Cuff Size: Normal)   Pulse 71   Temp (!) 96.4 F (35.8 C) (Temporal)   Resp 18   Wt 198 lb (89.8 kg)   BMI 28.41 kg/m    Physical Exam:  Physical Exam  Constitutional: He is oriented to person, place, and time. He appears well-developed.  HENT:  Head: Normocephalic.  Eyes: Pupils are equal, round, and reactive to light.  Neck: Normal range of motion.  Cardiovascular: Normal rate.  Pulmonary/Chest: Effort normal.  Abdominal: Soft. He exhibits no distension. There is no tenderness.  Staples removed, minor irritation at the staple sites, but no extending erythema or drainage, steri strips applied  Musculoskeletal: Normal range of motion. He exhibits no edema.  Neurological: He is alert and oriented to person, place, and time.  Vitals reviewed.   Laboratory studies: None   Imaging:  None    Assessment:  77 y.o. yo Male s/p laparoscopic right hemicolectomy for colon cancer, T3N0 on pathology. Doing well.   Plan:  No heavy lifting > 10 lbs, no excessive bending, squatting, pushing, pulling for 6-8 weeks post op. Increase activity slowly. Diet as tolerated, continue protein shakes. Follow up with oncology regarding plans for additional treatment.   Will send information to oncologist, and patient given copy of pathology report  All of the above recommendations were discussed with the patient and patient's family, and all of  patient's and family's questions were answered to their expressed satisfaction.  Curlene Labrum, MD [facility] [address] [place], [place] [address] [phone] (office)

## 2018-07-05 NOTE — Patient Instructions (Signed)
No heavy lifting > 10 lbs, no excessive bending, squatting, pushing, pulling for 6-8 weeks post op. Increase activity slowly. Diet as tolerated, continue protein shakes. Follow up with oncology regarding plans for additional treatment.

## 2019-01-15 ENCOUNTER — Telehealth (INDEPENDENT_AMBULATORY_CARE_PROVIDER_SITE_OTHER): Payer: Self-pay | Admitting: *Deleted

## 2019-01-15 NOTE — Telephone Encounter (Signed)
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 -- please advise

## 2019-01-16 ENCOUNTER — Telehealth (INDEPENDENT_AMBULATORY_CARE_PROVIDER_SITE_OTHER): Payer: Self-pay | Admitting: Internal Medicine

## 2019-01-16 NOTE — Telephone Encounter (Signed)
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

## 2019-01-16 NOTE — Telephone Encounter (Signed)
June 2020

## 2019-01-16 NOTE — Telephone Encounter (Signed)
Message has been sent to Dr Laural Golden for recommendations

## 2019-01-17 NOTE — Telephone Encounter (Signed)
TCS noted in recall for June 2020

## 2019-05-09 ENCOUNTER — Encounter (INDEPENDENT_AMBULATORY_CARE_PROVIDER_SITE_OTHER): Payer: Self-pay | Admitting: *Deleted

## 2019-06-13 ENCOUNTER — Encounter (INDEPENDENT_AMBULATORY_CARE_PROVIDER_SITE_OTHER): Payer: Self-pay | Admitting: *Deleted

## 2019-06-13 NOTE — Telephone Encounter (Signed)
error    This encounter was created in error - please disregard.

## 2019-06-15 ENCOUNTER — Encounter (INDEPENDENT_AMBULATORY_CARE_PROVIDER_SITE_OTHER): Payer: Self-pay | Admitting: Internal Medicine

## 2019-06-19 ENCOUNTER — Telehealth: Payer: Self-pay | Admitting: General Surgery

## 2019-06-19 NOTE — Telephone Encounter (Signed)
[facility]  Called patient to check on him after receiving information from Dr. Dionicio Stall  I had updated Dr. Laural Golden. Patient going to get immunotherapy and chemotherapy.  Allen Hebert is doing fair. Wife expressed that she and patient are grateful to Dr. Laural Golden and myself. They will keep [place] updated.   Allen Labrum, MD [facility] [address] [place], [place] [address] [phone] (office)

## 2019-06-20 NOTE — Telephone Encounter (Signed)
This encounter was created in error - please disregard.

## 2019-12-08 IMAGING — DX DG CHEST 2V
2 series · 2 of 2 positions shown · non-contrast
Comparison: None.

CLINICAL DATA: Preop evaluation.  Colon cancer.

EXAM:
CHEST - 2 VIEW

[chest pa]
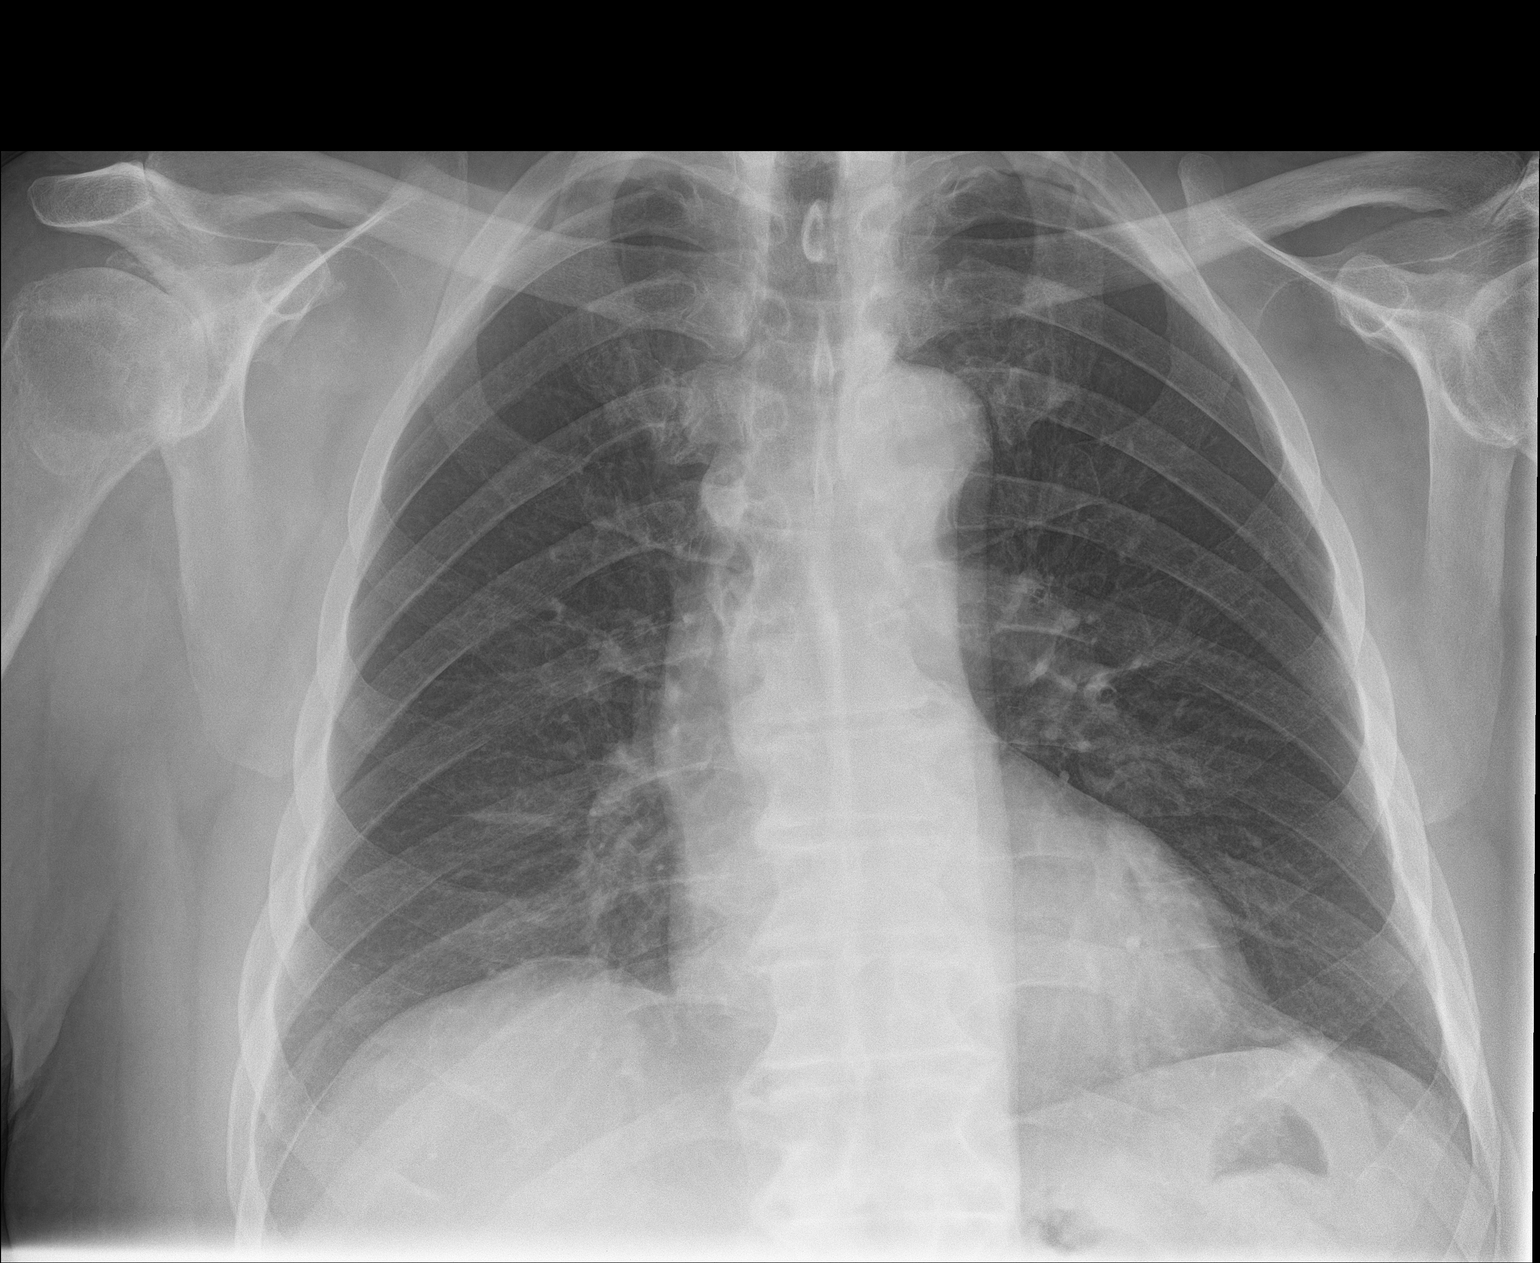

[chest lat]
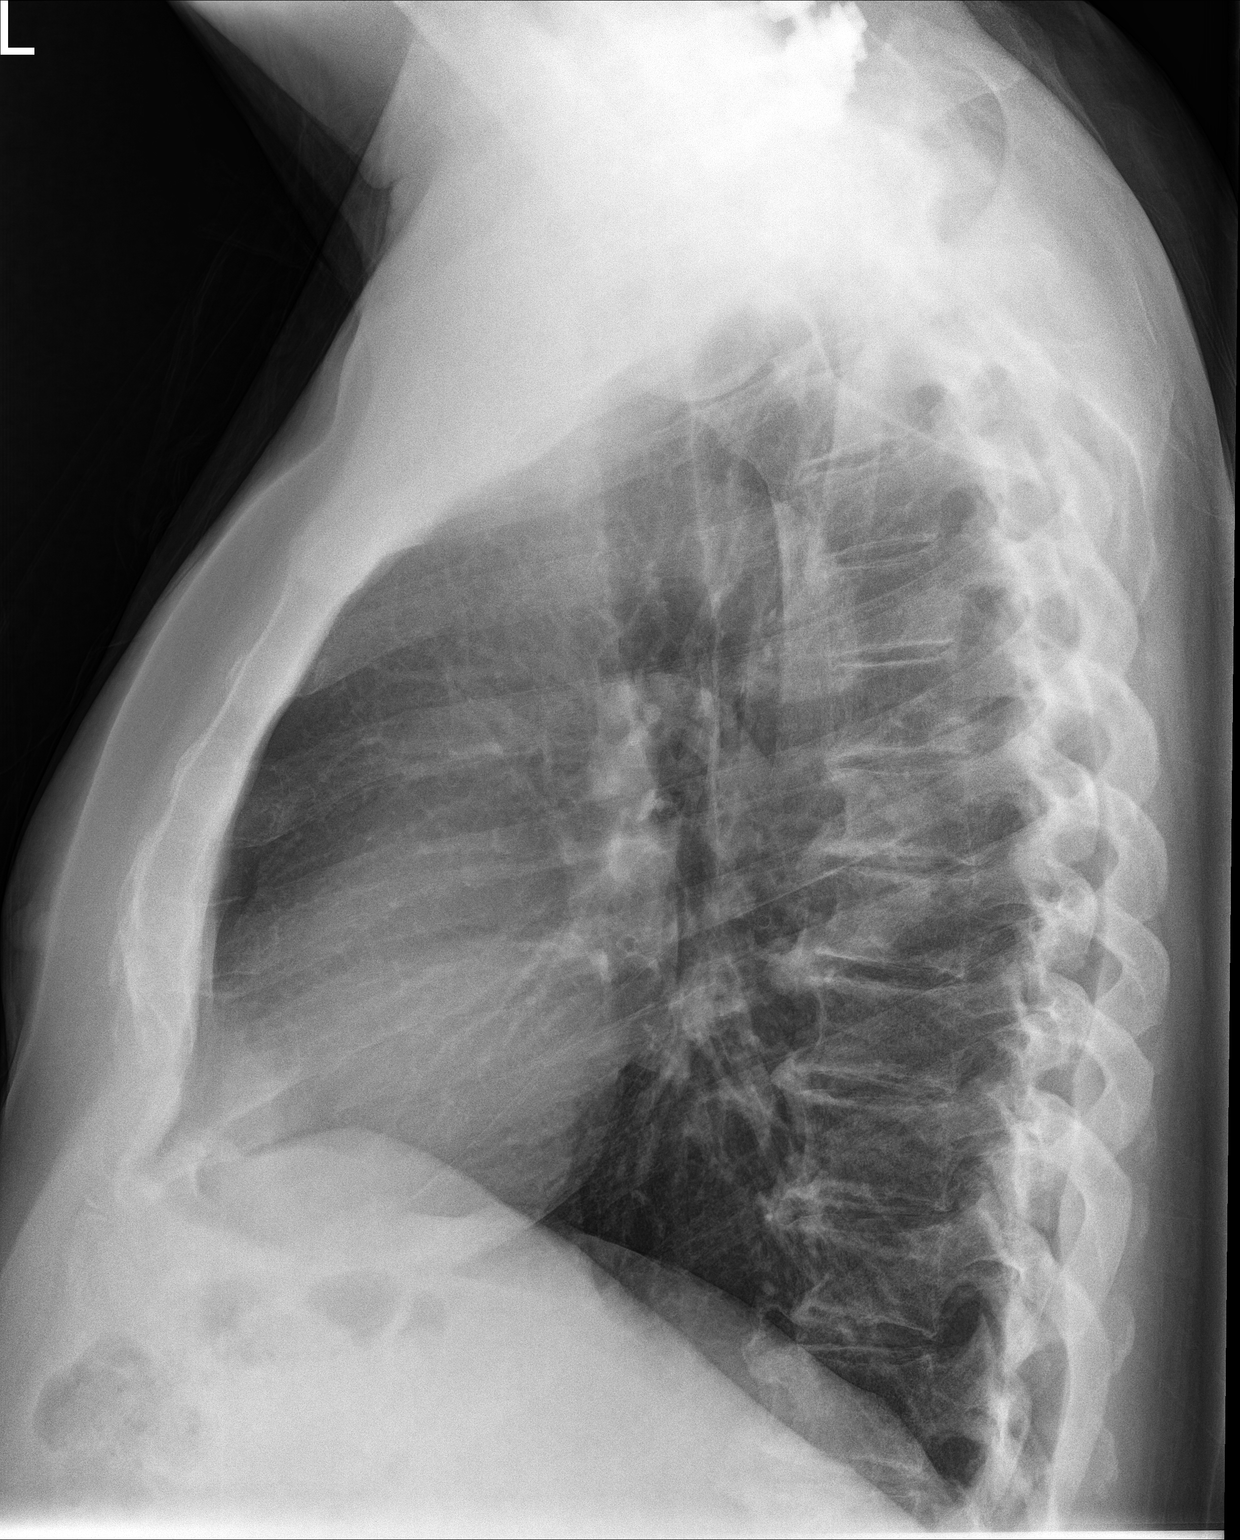

[2 of 2 positions shown; findings below may reference images not displayed]

FINDINGS: Normal heart size and mediastinal contours. The lungs are clear. No
effusion or pneumothorax. Spondylosis. No acute or aggressive
osseous findings.
IMPRESSION: Negative chest.

## 2021-11-17 ENCOUNTER — Telehealth: Payer: Self-pay | Admitting: *Deleted

## 2021-11-17 NOTE — Telephone Encounter (Signed)
Received call from patient wife Peter [place].   Reports that patient passed on 11/25/21 after a long battle.   Patient wife wanted to thank provider for care given.

## 2021-12-12 DEATH — deceased
